# Patient Record
Sex: Male | Born: 1955 | Marital: Married | Smoking: Former smoker
Health system: Southern US, Community
[De-identification: ages and names within clinical notes are randomized; demographics above are authoritative.]

## PROBLEM LIST (undated history)

## (undated) DIAGNOSIS — E119 Type 2 diabetes mellitus without complications: Secondary | ICD-10-CM

---

## 2017-07-02 DIAGNOSIS — I10 Essential (primary) hypertension: Secondary | ICD-10-CM | POA: Diagnosis not present

## 2017-07-02 DIAGNOSIS — E1149 Type 2 diabetes mellitus with other diabetic neurological complication: Secondary | ICD-10-CM | POA: Diagnosis not present

## 2017-07-02 DIAGNOSIS — Z903 Acquired absence of stomach [part of]: Secondary | ICD-10-CM | POA: Diagnosis not present

## 2017-07-02 DIAGNOSIS — G20 Parkinson's disease: Secondary | ICD-10-CM | POA: Diagnosis not present

## 2017-07-02 DIAGNOSIS — Z6821 Body mass index (BMI) 21.0-21.9, adult: Secondary | ICD-10-CM | POA: Diagnosis not present

## 2017-07-02 DIAGNOSIS — Z794 Long term (current) use of insulin: Secondary | ICD-10-CM | POA: Diagnosis not present

## 2017-07-02 DIAGNOSIS — E785 Hyperlipidemia, unspecified: Secondary | ICD-10-CM | POA: Diagnosis not present

## 2017-07-02 DIAGNOSIS — I2510 Atherosclerotic heart disease of native coronary artery without angina pectoris: Secondary | ICD-10-CM | POA: Diagnosis not present

## 2017-07-21 DIAGNOSIS — R262 Difficulty in walking, not elsewhere classified: Secondary | ICD-10-CM | POA: Diagnosis not present

## 2017-07-21 DIAGNOSIS — Z6821 Body mass index (BMI) 21.0-21.9, adult: Secondary | ICD-10-CM | POA: Diagnosis not present

## 2017-07-21 DIAGNOSIS — G20 Parkinson's disease: Secondary | ICD-10-CM | POA: Diagnosis not present

## 2017-07-21 DIAGNOSIS — R739 Hyperglycemia, unspecified: Secondary | ICD-10-CM | POA: Diagnosis not present

## 2017-07-21 DIAGNOSIS — E1149 Type 2 diabetes mellitus with other diabetic neurological complication: Secondary | ICD-10-CM | POA: Diagnosis not present

## 2017-07-21 DIAGNOSIS — W19XXXA Unspecified fall, initial encounter: Secondary | ICD-10-CM | POA: Diagnosis not present

## 2017-07-29 DIAGNOSIS — R260 Ataxic gait: Secondary | ICD-10-CM | POA: Diagnosis not present

## 2017-07-29 DIAGNOSIS — W19XXXA Unspecified fall, initial encounter: Secondary | ICD-10-CM | POA: Diagnosis not present

## 2017-09-01 DIAGNOSIS — Z23 Encounter for immunization: Secondary | ICD-10-CM | POA: Diagnosis not present

## 2017-09-06 DIAGNOSIS — G20 Parkinson's disease: Secondary | ICD-10-CM | POA: Diagnosis not present

## 2017-09-06 DIAGNOSIS — R1312 Dysphagia, oropharyngeal phase: Secondary | ICD-10-CM | POA: Diagnosis not present

## 2017-11-01 DIAGNOSIS — Z9049 Acquired absence of other specified parts of digestive tract: Secondary | ICD-10-CM | POA: Diagnosis not present

## 2017-11-01 DIAGNOSIS — E1140 Type 2 diabetes mellitus with diabetic neuropathy, unspecified: Secondary | ICD-10-CM | POA: Diagnosis not present

## 2017-11-01 DIAGNOSIS — I2510 Atherosclerotic heart disease of native coronary artery without angina pectoris: Secondary | ICD-10-CM | POA: Diagnosis not present

## 2017-11-01 DIAGNOSIS — I10 Essential (primary) hypertension: Secondary | ICD-10-CM | POA: Diagnosis not present

## 2017-11-01 DIAGNOSIS — E785 Hyperlipidemia, unspecified: Secondary | ICD-10-CM | POA: Diagnosis not present

## 2017-11-11 DIAGNOSIS — Z87891 Personal history of nicotine dependence: Secondary | ICD-10-CM | POA: Diagnosis not present

## 2017-11-11 DIAGNOSIS — I6789 Other cerebrovascular disease: Secondary | ICD-10-CM | POA: Diagnosis not present

## 2017-11-11 DIAGNOSIS — Z7984 Long term (current) use of oral hypoglycemic drugs: Secondary | ICD-10-CM | POA: Diagnosis not present

## 2017-11-11 DIAGNOSIS — M25511 Pain in right shoulder: Secondary | ICD-10-CM | POA: Diagnosis not present

## 2017-11-11 DIAGNOSIS — G20 Parkinson's disease: Secondary | ICD-10-CM | POA: Diagnosis not present

## 2017-11-11 DIAGNOSIS — R05 Cough: Secondary | ICD-10-CM | POA: Diagnosis not present

## 2017-11-11 DIAGNOSIS — R270 Ataxia, unspecified: Secondary | ICD-10-CM | POA: Diagnosis not present

## 2017-11-11 DIAGNOSIS — Z79891 Long term (current) use of opiate analgesic: Secondary | ICD-10-CM | POA: Diagnosis not present

## 2017-11-11 DIAGNOSIS — Z79899 Other long term (current) drug therapy: Secondary | ICD-10-CM | POA: Diagnosis not present

## 2017-11-11 DIAGNOSIS — Z9181 History of falling: Secondary | ICD-10-CM | POA: Diagnosis not present

## 2017-11-11 DIAGNOSIS — I10 Essential (primary) hypertension: Secondary | ICD-10-CM | POA: Diagnosis not present

## 2017-11-11 DIAGNOSIS — E119 Type 2 diabetes mellitus without complications: Secondary | ICD-10-CM | POA: Diagnosis not present

## 2017-11-11 DIAGNOSIS — R4781 Slurred speech: Secondary | ICD-10-CM | POA: Diagnosis not present

## 2017-11-11 DIAGNOSIS — R531 Weakness: Secondary | ICD-10-CM | POA: Diagnosis not present

## 2017-11-13 DIAGNOSIS — Z794 Long term (current) use of insulin: Secondary | ICD-10-CM | POA: Diagnosis not present

## 2017-11-13 DIAGNOSIS — I10 Essential (primary) hypertension: Secondary | ICD-10-CM | POA: Diagnosis not present

## 2017-11-13 DIAGNOSIS — M25511 Pain in right shoulder: Secondary | ICD-10-CM | POA: Diagnosis not present

## 2017-11-13 DIAGNOSIS — E119 Type 2 diabetes mellitus without complications: Secondary | ICD-10-CM | POA: Diagnosis not present

## 2017-11-13 DIAGNOSIS — Z87891 Personal history of nicotine dependence: Secondary | ICD-10-CM | POA: Diagnosis not present

## 2017-11-13 DIAGNOSIS — G20 Parkinson's disease: Secondary | ICD-10-CM | POA: Diagnosis not present

## 2017-11-13 DIAGNOSIS — Z9181 History of falling: Secondary | ICD-10-CM | POA: Diagnosis not present

## 2017-11-15 DIAGNOSIS — M25511 Pain in right shoulder: Secondary | ICD-10-CM | POA: Diagnosis not present

## 2017-11-15 DIAGNOSIS — Z794 Long term (current) use of insulin: Secondary | ICD-10-CM | POA: Diagnosis not present

## 2017-11-15 DIAGNOSIS — Z87891 Personal history of nicotine dependence: Secondary | ICD-10-CM | POA: Diagnosis not present

## 2017-11-15 DIAGNOSIS — Z9181 History of falling: Secondary | ICD-10-CM | POA: Diagnosis not present

## 2017-11-15 DIAGNOSIS — G20 Parkinson's disease: Secondary | ICD-10-CM | POA: Diagnosis not present

## 2017-11-15 DIAGNOSIS — I10 Essential (primary) hypertension: Secondary | ICD-10-CM | POA: Diagnosis not present

## 2017-11-15 DIAGNOSIS — E119 Type 2 diabetes mellitus without complications: Secondary | ICD-10-CM | POA: Diagnosis not present

## 2017-11-16 DIAGNOSIS — Z9181 History of falling: Secondary | ICD-10-CM | POA: Diagnosis not present

## 2017-11-16 DIAGNOSIS — Z794 Long term (current) use of insulin: Secondary | ICD-10-CM | POA: Diagnosis not present

## 2017-11-16 DIAGNOSIS — Z87891 Personal history of nicotine dependence: Secondary | ICD-10-CM | POA: Diagnosis not present

## 2017-11-16 DIAGNOSIS — G20 Parkinson's disease: Secondary | ICD-10-CM | POA: Diagnosis not present

## 2017-11-16 DIAGNOSIS — M25511 Pain in right shoulder: Secondary | ICD-10-CM | POA: Diagnosis not present

## 2017-11-16 DIAGNOSIS — I10 Essential (primary) hypertension: Secondary | ICD-10-CM | POA: Diagnosis not present

## 2017-11-16 DIAGNOSIS — E119 Type 2 diabetes mellitus without complications: Secondary | ICD-10-CM | POA: Diagnosis not present

## 2017-11-18 DIAGNOSIS — Z794 Long term (current) use of insulin: Secondary | ICD-10-CM | POA: Diagnosis not present

## 2017-11-18 DIAGNOSIS — I10 Essential (primary) hypertension: Secondary | ICD-10-CM | POA: Diagnosis not present

## 2017-11-18 DIAGNOSIS — G20 Parkinson's disease: Secondary | ICD-10-CM | POA: Diagnosis not present

## 2017-11-18 DIAGNOSIS — E119 Type 2 diabetes mellitus without complications: Secondary | ICD-10-CM | POA: Diagnosis not present

## 2017-11-18 DIAGNOSIS — M25511 Pain in right shoulder: Secondary | ICD-10-CM | POA: Diagnosis not present

## 2017-11-18 DIAGNOSIS — Z9181 History of falling: Secondary | ICD-10-CM | POA: Diagnosis not present

## 2017-11-18 DIAGNOSIS — Z87891 Personal history of nicotine dependence: Secondary | ICD-10-CM | POA: Diagnosis not present

## 2017-11-19 DIAGNOSIS — Z794 Long term (current) use of insulin: Secondary | ICD-10-CM | POA: Diagnosis not present

## 2017-11-19 DIAGNOSIS — Z87891 Personal history of nicotine dependence: Secondary | ICD-10-CM | POA: Diagnosis not present

## 2017-11-19 DIAGNOSIS — I10 Essential (primary) hypertension: Secondary | ICD-10-CM | POA: Diagnosis not present

## 2017-11-19 DIAGNOSIS — Z9181 History of falling: Secondary | ICD-10-CM | POA: Diagnosis not present

## 2017-11-19 DIAGNOSIS — G20 Parkinson's disease: Secondary | ICD-10-CM | POA: Diagnosis not present

## 2017-11-19 DIAGNOSIS — E119 Type 2 diabetes mellitus without complications: Secondary | ICD-10-CM | POA: Diagnosis not present

## 2017-11-19 DIAGNOSIS — M25511 Pain in right shoulder: Secondary | ICD-10-CM | POA: Diagnosis not present

## 2017-11-22 ENCOUNTER — Other Ambulatory Visit: Payer: Self-pay | Admitting: *Deleted

## 2017-11-22 NOTE — Patient Outreach (Signed)
[facility]) Care Management  11/22/2017  Verline Lemaerry R Simerly 25-Mar-1956 [id number]   Outreach attempt #1 to patient. No answer. RN CM left HIPAA compliant message along with contact info.    Plan: RN CM will contact patient within one week.  Wynelle ClevelandJuanita Cashis Rill, RN, BSN, MHA/MSL, [facility] Telephonic Care Manager Coordinator [facility] Direct Phone: [phone] Cell Phone: [phone] Toll Free: [phone] Fax: [phone]

## 2017-11-24 DIAGNOSIS — Z794 Long term (current) use of insulin: Secondary | ICD-10-CM | POA: Diagnosis not present

## 2017-11-24 DIAGNOSIS — G20 Parkinson's disease: Secondary | ICD-10-CM | POA: Diagnosis not present

## 2017-11-24 DIAGNOSIS — E119 Type 2 diabetes mellitus without complications: Secondary | ICD-10-CM | POA: Diagnosis not present

## 2017-11-24 DIAGNOSIS — I10 Essential (primary) hypertension: Secondary | ICD-10-CM | POA: Diagnosis not present

## 2017-11-24 DIAGNOSIS — Z87891 Personal history of nicotine dependence: Secondary | ICD-10-CM | POA: Diagnosis not present

## 2017-11-24 DIAGNOSIS — Z9181 History of falling: Secondary | ICD-10-CM | POA: Diagnosis not present

## 2017-11-24 DIAGNOSIS — R278 Other lack of coordination: Secondary | ICD-10-CM | POA: Diagnosis not present

## 2017-11-24 DIAGNOSIS — M25511 Pain in right shoulder: Secondary | ICD-10-CM | POA: Diagnosis not present

## 2017-11-25 ENCOUNTER — Other Ambulatory Visit: Payer: Self-pay | Admitting: *Deleted

## 2017-11-25 DIAGNOSIS — Z87891 Personal history of nicotine dependence: Secondary | ICD-10-CM | POA: Diagnosis not present

## 2017-11-25 DIAGNOSIS — M25511 Pain in right shoulder: Secondary | ICD-10-CM | POA: Diagnosis not present

## 2017-11-25 DIAGNOSIS — Z9181 History of falling: Secondary | ICD-10-CM | POA: Diagnosis not present

## 2017-11-25 DIAGNOSIS — I10 Essential (primary) hypertension: Secondary | ICD-10-CM | POA: Diagnosis not present

## 2017-11-25 DIAGNOSIS — E119 Type 2 diabetes mellitus without complications: Secondary | ICD-10-CM | POA: Diagnosis not present

## 2017-11-25 DIAGNOSIS — Z794 Long term (current) use of insulin: Secondary | ICD-10-CM | POA: Diagnosis not present

## 2017-11-25 DIAGNOSIS — G20 Parkinson's disease: Secondary | ICD-10-CM | POA: Diagnosis not present

## 2017-11-25 NOTE — Patient Outreach (Signed)
[facility]) Care Management  11/25/2017  Joseph Mckay 1956/06/17 [id number]   Telephone Screen  Referral Date: 11/22/17 Referral Source: [employer] Referral Reason: His wife, Kennon HolterWanda Probus called me to advise that Joseph Mckay has been suffering from Parkinson's and the condition and medicine and her back issures make it hard to handle everyday tasks for his care.  Insurance: [employer]  Outreach attempt # 2 to patient. No answer. RN CM left HIPAA compliant message along with contact info.   Plan: RN CM will contact patient within one week.  Wynelle ClevelandJuanita Cinch Ormond, RN, BSN, MHA/MSL, [facility] Telephonic Care Manager Coordinator [facility] Direct Phone: [phone] Cell Phone: [phone] Toll Free: [phone] Fax: [phone]

## 2017-11-26 ENCOUNTER — Other Ambulatory Visit: Payer: Self-pay | Admitting: *Deleted

## 2017-11-26 ENCOUNTER — Ambulatory Visit: Payer: Self-pay | Admitting: *Deleted

## 2017-11-26 DIAGNOSIS — Z87891 Personal history of nicotine dependence: Secondary | ICD-10-CM | POA: Diagnosis not present

## 2017-11-26 DIAGNOSIS — H2513 Age-related nuclear cataract, bilateral: Secondary | ICD-10-CM | POA: Diagnosis not present

## 2017-11-26 DIAGNOSIS — Z9181 History of falling: Secondary | ICD-10-CM | POA: Diagnosis not present

## 2017-11-26 DIAGNOSIS — Z794 Long term (current) use of insulin: Secondary | ICD-10-CM | POA: Diagnosis not present

## 2017-11-26 DIAGNOSIS — G20 Parkinson's disease: Secondary | ICD-10-CM | POA: Diagnosis not present

## 2017-11-26 DIAGNOSIS — I10 Essential (primary) hypertension: Secondary | ICD-10-CM | POA: Diagnosis not present

## 2017-11-26 DIAGNOSIS — M25511 Pain in right shoulder: Secondary | ICD-10-CM | POA: Diagnosis not present

## 2017-11-26 DIAGNOSIS — E119 Type 2 diabetes mellitus without complications: Secondary | ICD-10-CM | POA: Diagnosis not present

## 2017-11-29 NOTE — Patient Outreach (Signed)
[facility]) Care Management  11/26/17  Verline Lemaerry R Suleiman 10-08-1956 [id number]   Telephone outreach to patient. HIPAA verified with patient. Patient requested for RN CM to speak with his spouse. Patient does not have a [facility] MD. He doesn't qualify for any [facility] services.   Plan: RN CM will notify [facility] Case Management Assistant regarding case closure.  RN CM to notify patient regarding case closure reason.  Wynelle ClevelandJuanita Candon Caras, RN, BSN, MHA/MSL, [facility] Telephonic Care Manager Coordinator [facility] Direct Phone: [phone] Cell Phone: [phone] Toll Free: [phone] Fax: [phone]

## 2017-12-02 ENCOUNTER — Ambulatory Visit: Payer: Self-pay | Admitting: *Deleted

## 2017-12-02 DIAGNOSIS — E119 Type 2 diabetes mellitus without complications: Secondary | ICD-10-CM | POA: Diagnosis not present

## 2017-12-02 DIAGNOSIS — G20 Parkinson's disease: Secondary | ICD-10-CM | POA: Diagnosis not present

## 2017-12-02 DIAGNOSIS — Z794 Long term (current) use of insulin: Secondary | ICD-10-CM | POA: Diagnosis not present

## 2017-12-02 DIAGNOSIS — Z87891 Personal history of nicotine dependence: Secondary | ICD-10-CM | POA: Diagnosis not present

## 2017-12-02 DIAGNOSIS — M25511 Pain in right shoulder: Secondary | ICD-10-CM | POA: Diagnosis not present

## 2017-12-02 DIAGNOSIS — I10 Essential (primary) hypertension: Secondary | ICD-10-CM | POA: Diagnosis not present

## 2017-12-02 DIAGNOSIS — Z9181 History of falling: Secondary | ICD-10-CM | POA: Diagnosis not present

## 2017-12-02 DIAGNOSIS — R1312 Dysphagia, oropharyngeal phase: Secondary | ICD-10-CM | POA: Diagnosis not present

## 2017-12-03 ENCOUNTER — Other Ambulatory Visit: Payer: Self-pay | Admitting: *Deleted

## 2017-12-03 DIAGNOSIS — Z9181 History of falling: Secondary | ICD-10-CM | POA: Diagnosis not present

## 2017-12-03 DIAGNOSIS — I10 Essential (primary) hypertension: Secondary | ICD-10-CM | POA: Diagnosis not present

## 2017-12-03 DIAGNOSIS — G20 Parkinson's disease: Secondary | ICD-10-CM | POA: Diagnosis not present

## 2017-12-03 DIAGNOSIS — Z87891 Personal history of nicotine dependence: Secondary | ICD-10-CM | POA: Diagnosis not present

## 2017-12-03 DIAGNOSIS — M25511 Pain in right shoulder: Secondary | ICD-10-CM | POA: Diagnosis not present

## 2017-12-03 DIAGNOSIS — Z794 Long term (current) use of insulin: Secondary | ICD-10-CM | POA: Diagnosis not present

## 2017-12-03 DIAGNOSIS — E119 Type 2 diabetes mellitus without complications: Secondary | ICD-10-CM | POA: Diagnosis not present

## 2017-12-03 DIAGNOSIS — R1312 Dysphagia, oropharyngeal phase: Secondary | ICD-10-CM | POA: Diagnosis not present

## 2017-12-03 NOTE — Patient Outreach (Signed)
[facility]) Care Management  12/02/17  Joseph Mckay Joseph Mckay 03/07/1956 [id number]   Outreach telephone call to patient. HIPAA verified with patient's spouse, per patient's request. Spouse informed that patient doesn't qualify for [facility] services. Patient's primary MD is not a [facility]. Spouse became very upset. She stated, she and patient was told patient qualified for in-home patient services. RN CM told spouse patient qualifies for in-home patient services, however he doesn't qualify for [facility] services. Spouse stated, "She has been told so many lies by the insurance company". RN CM attempted to explain [facility] services and benefits. Spouse was ready to end the telephone call.   Plan: RN CM will notify [facility] CM administrative assistant regarding case closure.  RN CM will send patient case closure letter.   Wynelle ClevelandJuanita Carlton Buskey, RN, BSN, MHA/MSL, [facility] Telephonic Care Manager Coordinator [facility] Direct Phone: [phone] Cell Phone: [phone] Toll Free: [phone] Fax: [phone]

## 2017-12-07 DIAGNOSIS — G20 Parkinson's disease: Secondary | ICD-10-CM | POA: Diagnosis not present

## 2017-12-07 DIAGNOSIS — Z9181 History of falling: Secondary | ICD-10-CM | POA: Diagnosis not present

## 2017-12-07 DIAGNOSIS — I10 Essential (primary) hypertension: Secondary | ICD-10-CM | POA: Diagnosis not present

## 2017-12-07 DIAGNOSIS — E119 Type 2 diabetes mellitus without complications: Secondary | ICD-10-CM | POA: Diagnosis not present

## 2017-12-07 DIAGNOSIS — Z794 Long term (current) use of insulin: Secondary | ICD-10-CM | POA: Diagnosis not present

## 2017-12-07 DIAGNOSIS — Z87891 Personal history of nicotine dependence: Secondary | ICD-10-CM | POA: Diagnosis not present

## 2017-12-07 DIAGNOSIS — R1312 Dysphagia, oropharyngeal phase: Secondary | ICD-10-CM | POA: Diagnosis not present

## 2017-12-07 DIAGNOSIS — M25511 Pain in right shoulder: Secondary | ICD-10-CM | POA: Diagnosis not present

## 2017-12-08 DIAGNOSIS — I10 Essential (primary) hypertension: Secondary | ICD-10-CM | POA: Diagnosis not present

## 2017-12-08 DIAGNOSIS — Z9181 History of falling: Secondary | ICD-10-CM | POA: Diagnosis not present

## 2017-12-08 DIAGNOSIS — Z87891 Personal history of nicotine dependence: Secondary | ICD-10-CM | POA: Diagnosis not present

## 2017-12-08 DIAGNOSIS — G20 Parkinson's disease: Secondary | ICD-10-CM | POA: Diagnosis not present

## 2017-12-08 DIAGNOSIS — Z794 Long term (current) use of insulin: Secondary | ICD-10-CM | POA: Diagnosis not present

## 2017-12-08 DIAGNOSIS — E119 Type 2 diabetes mellitus without complications: Secondary | ICD-10-CM | POA: Diagnosis not present

## 2017-12-08 DIAGNOSIS — M25511 Pain in right shoulder: Secondary | ICD-10-CM | POA: Diagnosis not present

## 2017-12-08 DIAGNOSIS — R1312 Dysphagia, oropharyngeal phase: Secondary | ICD-10-CM | POA: Diagnosis not present

## 2017-12-09 DIAGNOSIS — R1312 Dysphagia, oropharyngeal phase: Secondary | ICD-10-CM | POA: Diagnosis not present

## 2017-12-09 DIAGNOSIS — E119 Type 2 diabetes mellitus without complications: Secondary | ICD-10-CM | POA: Diagnosis not present

## 2017-12-09 DIAGNOSIS — Z9181 History of falling: Secondary | ICD-10-CM | POA: Diagnosis not present

## 2017-12-09 DIAGNOSIS — I10 Essential (primary) hypertension: Secondary | ICD-10-CM | POA: Diagnosis not present

## 2017-12-09 DIAGNOSIS — G20 Parkinson's disease: Secondary | ICD-10-CM | POA: Diagnosis not present

## 2017-12-09 DIAGNOSIS — Z794 Long term (current) use of insulin: Secondary | ICD-10-CM | POA: Diagnosis not present

## 2017-12-09 DIAGNOSIS — Z87891 Personal history of nicotine dependence: Secondary | ICD-10-CM | POA: Diagnosis not present

## 2017-12-09 DIAGNOSIS — M25511 Pain in right shoulder: Secondary | ICD-10-CM | POA: Diagnosis not present

## 2017-12-13 DIAGNOSIS — I10 Essential (primary) hypertension: Secondary | ICD-10-CM | POA: Diagnosis not present

## 2017-12-13 DIAGNOSIS — Z9181 History of falling: Secondary | ICD-10-CM | POA: Diagnosis not present

## 2017-12-13 DIAGNOSIS — G20 Parkinson's disease: Secondary | ICD-10-CM | POA: Diagnosis not present

## 2017-12-13 DIAGNOSIS — E119 Type 2 diabetes mellitus without complications: Secondary | ICD-10-CM | POA: Diagnosis not present

## 2017-12-13 DIAGNOSIS — R1312 Dysphagia, oropharyngeal phase: Secondary | ICD-10-CM | POA: Diagnosis not present

## 2017-12-13 DIAGNOSIS — Z87891 Personal history of nicotine dependence: Secondary | ICD-10-CM | POA: Diagnosis not present

## 2017-12-13 DIAGNOSIS — Z794 Long term (current) use of insulin: Secondary | ICD-10-CM | POA: Diagnosis not present

## 2017-12-13 DIAGNOSIS — M25511 Pain in right shoulder: Secondary | ICD-10-CM | POA: Diagnosis not present

## 2017-12-14 DIAGNOSIS — E119 Type 2 diabetes mellitus without complications: Secondary | ICD-10-CM | POA: Diagnosis not present

## 2017-12-14 DIAGNOSIS — R1312 Dysphagia, oropharyngeal phase: Secondary | ICD-10-CM | POA: Diagnosis not present

## 2017-12-14 DIAGNOSIS — Z794 Long term (current) use of insulin: Secondary | ICD-10-CM | POA: Diagnosis not present

## 2017-12-14 DIAGNOSIS — G20 Parkinson's disease: Secondary | ICD-10-CM | POA: Diagnosis not present

## 2017-12-14 DIAGNOSIS — Z87891 Personal history of nicotine dependence: Secondary | ICD-10-CM | POA: Diagnosis not present

## 2017-12-14 DIAGNOSIS — M25511 Pain in right shoulder: Secondary | ICD-10-CM | POA: Diagnosis not present

## 2017-12-14 DIAGNOSIS — Z9181 History of falling: Secondary | ICD-10-CM | POA: Diagnosis not present

## 2017-12-14 DIAGNOSIS — I10 Essential (primary) hypertension: Secondary | ICD-10-CM | POA: Diagnosis not present

## 2017-12-15 DIAGNOSIS — I10 Essential (primary) hypertension: Secondary | ICD-10-CM | POA: Diagnosis not present

## 2017-12-15 DIAGNOSIS — G20 Parkinson's disease: Secondary | ICD-10-CM | POA: Diagnosis not present

## 2017-12-15 DIAGNOSIS — Z87891 Personal history of nicotine dependence: Secondary | ICD-10-CM | POA: Diagnosis not present

## 2017-12-15 DIAGNOSIS — Z9181 History of falling: Secondary | ICD-10-CM | POA: Diagnosis not present

## 2017-12-15 DIAGNOSIS — E119 Type 2 diabetes mellitus without complications: Secondary | ICD-10-CM | POA: Diagnosis not present

## 2017-12-15 DIAGNOSIS — M25511 Pain in right shoulder: Secondary | ICD-10-CM | POA: Diagnosis not present

## 2017-12-15 DIAGNOSIS — Z794 Long term (current) use of insulin: Secondary | ICD-10-CM | POA: Diagnosis not present

## 2017-12-15 DIAGNOSIS — R1312 Dysphagia, oropharyngeal phase: Secondary | ICD-10-CM | POA: Diagnosis not present

## 2017-12-17 DIAGNOSIS — M25511 Pain in right shoulder: Secondary | ICD-10-CM | POA: Diagnosis not present

## 2017-12-17 DIAGNOSIS — Z87891 Personal history of nicotine dependence: Secondary | ICD-10-CM | POA: Diagnosis not present

## 2017-12-17 DIAGNOSIS — E119 Type 2 diabetes mellitus without complications: Secondary | ICD-10-CM | POA: Diagnosis not present

## 2017-12-17 DIAGNOSIS — Z9181 History of falling: Secondary | ICD-10-CM | POA: Diagnosis not present

## 2017-12-17 DIAGNOSIS — Z794 Long term (current) use of insulin: Secondary | ICD-10-CM | POA: Diagnosis not present

## 2017-12-17 DIAGNOSIS — R1312 Dysphagia, oropharyngeal phase: Secondary | ICD-10-CM | POA: Diagnosis not present

## 2017-12-17 DIAGNOSIS — I10 Essential (primary) hypertension: Secondary | ICD-10-CM | POA: Diagnosis not present

## 2017-12-17 DIAGNOSIS — G20 Parkinson's disease: Secondary | ICD-10-CM | POA: Diagnosis not present

## 2017-12-21 DIAGNOSIS — E119 Type 2 diabetes mellitus without complications: Secondary | ICD-10-CM | POA: Diagnosis not present

## 2017-12-21 DIAGNOSIS — G20 Parkinson's disease: Secondary | ICD-10-CM | POA: Diagnosis not present

## 2017-12-21 DIAGNOSIS — Z9181 History of falling: Secondary | ICD-10-CM | POA: Diagnosis not present

## 2017-12-21 DIAGNOSIS — Z794 Long term (current) use of insulin: Secondary | ICD-10-CM | POA: Diagnosis not present

## 2017-12-21 DIAGNOSIS — I10 Essential (primary) hypertension: Secondary | ICD-10-CM | POA: Diagnosis not present

## 2017-12-21 DIAGNOSIS — M25511 Pain in right shoulder: Secondary | ICD-10-CM | POA: Diagnosis not present

## 2017-12-21 DIAGNOSIS — Z87891 Personal history of nicotine dependence: Secondary | ICD-10-CM | POA: Diagnosis not present

## 2017-12-21 DIAGNOSIS — R1312 Dysphagia, oropharyngeal phase: Secondary | ICD-10-CM | POA: Diagnosis not present

## 2017-12-22 DIAGNOSIS — Z87891 Personal history of nicotine dependence: Secondary | ICD-10-CM | POA: Diagnosis not present

## 2017-12-22 DIAGNOSIS — Z794 Long term (current) use of insulin: Secondary | ICD-10-CM | POA: Diagnosis not present

## 2017-12-22 DIAGNOSIS — R1312 Dysphagia, oropharyngeal phase: Secondary | ICD-10-CM | POA: Diagnosis not present

## 2017-12-22 DIAGNOSIS — G20 Parkinson's disease: Secondary | ICD-10-CM | POA: Diagnosis not present

## 2017-12-22 DIAGNOSIS — E119 Type 2 diabetes mellitus without complications: Secondary | ICD-10-CM | POA: Diagnosis not present

## 2017-12-22 DIAGNOSIS — I10 Essential (primary) hypertension: Secondary | ICD-10-CM | POA: Diagnosis not present

## 2017-12-22 DIAGNOSIS — M25511 Pain in right shoulder: Secondary | ICD-10-CM | POA: Diagnosis not present

## 2017-12-22 DIAGNOSIS — Z9181 History of falling: Secondary | ICD-10-CM | POA: Diagnosis not present

## 2017-12-23 DIAGNOSIS — Z794 Long term (current) use of insulin: Secondary | ICD-10-CM | POA: Diagnosis not present

## 2017-12-23 DIAGNOSIS — Z9181 History of falling: Secondary | ICD-10-CM | POA: Diagnosis not present

## 2017-12-23 DIAGNOSIS — G20 Parkinson's disease: Secondary | ICD-10-CM | POA: Diagnosis not present

## 2017-12-23 DIAGNOSIS — R1312 Dysphagia, oropharyngeal phase: Secondary | ICD-10-CM | POA: Diagnosis not present

## 2017-12-23 DIAGNOSIS — M25511 Pain in right shoulder: Secondary | ICD-10-CM | POA: Diagnosis not present

## 2017-12-23 DIAGNOSIS — E119 Type 2 diabetes mellitus without complications: Secondary | ICD-10-CM | POA: Diagnosis not present

## 2017-12-23 DIAGNOSIS — Z87891 Personal history of nicotine dependence: Secondary | ICD-10-CM | POA: Diagnosis not present

## 2017-12-23 DIAGNOSIS — I10 Essential (primary) hypertension: Secondary | ICD-10-CM | POA: Diagnosis not present

## 2017-12-24 DIAGNOSIS — Z87891 Personal history of nicotine dependence: Secondary | ICD-10-CM | POA: Diagnosis not present

## 2017-12-24 DIAGNOSIS — Z794 Long term (current) use of insulin: Secondary | ICD-10-CM | POA: Diagnosis not present

## 2017-12-24 DIAGNOSIS — I10 Essential (primary) hypertension: Secondary | ICD-10-CM | POA: Diagnosis not present

## 2017-12-24 DIAGNOSIS — R1312 Dysphagia, oropharyngeal phase: Secondary | ICD-10-CM | POA: Diagnosis not present

## 2017-12-24 DIAGNOSIS — Z9181 History of falling: Secondary | ICD-10-CM | POA: Diagnosis not present

## 2017-12-24 DIAGNOSIS — M25511 Pain in right shoulder: Secondary | ICD-10-CM | POA: Diagnosis not present

## 2017-12-24 DIAGNOSIS — E119 Type 2 diabetes mellitus without complications: Secondary | ICD-10-CM | POA: Diagnosis not present

## 2017-12-24 DIAGNOSIS — G20 Parkinson's disease: Secondary | ICD-10-CM | POA: Diagnosis not present

## 2017-12-27 DIAGNOSIS — Z87891 Personal history of nicotine dependence: Secondary | ICD-10-CM | POA: Diagnosis not present

## 2017-12-27 DIAGNOSIS — Z9181 History of falling: Secondary | ICD-10-CM | POA: Diagnosis not present

## 2017-12-27 DIAGNOSIS — Z794 Long term (current) use of insulin: Secondary | ICD-10-CM | POA: Diagnosis not present

## 2017-12-27 DIAGNOSIS — M25511 Pain in right shoulder: Secondary | ICD-10-CM | POA: Diagnosis not present

## 2017-12-27 DIAGNOSIS — I10 Essential (primary) hypertension: Secondary | ICD-10-CM | POA: Diagnosis not present

## 2017-12-27 DIAGNOSIS — E119 Type 2 diabetes mellitus without complications: Secondary | ICD-10-CM | POA: Diagnosis not present

## 2017-12-27 DIAGNOSIS — G20 Parkinson's disease: Secondary | ICD-10-CM | POA: Diagnosis not present

## 2017-12-27 DIAGNOSIS — R1312 Dysphagia, oropharyngeal phase: Secondary | ICD-10-CM | POA: Diagnosis not present

## 2018-01-03 DIAGNOSIS — G20 Parkinson's disease: Secondary | ICD-10-CM | POA: Diagnosis not present

## 2018-01-03 DIAGNOSIS — I10 Essential (primary) hypertension: Secondary | ICD-10-CM | POA: Diagnosis not present

## 2018-01-03 DIAGNOSIS — Z87891 Personal history of nicotine dependence: Secondary | ICD-10-CM | POA: Diagnosis not present

## 2018-01-03 DIAGNOSIS — Z9181 History of falling: Secondary | ICD-10-CM | POA: Diagnosis not present

## 2018-01-03 DIAGNOSIS — M25511 Pain in right shoulder: Secondary | ICD-10-CM | POA: Diagnosis not present

## 2018-01-03 DIAGNOSIS — R1312 Dysphagia, oropharyngeal phase: Secondary | ICD-10-CM | POA: Diagnosis not present

## 2018-01-03 DIAGNOSIS — E119 Type 2 diabetes mellitus without complications: Secondary | ICD-10-CM | POA: Diagnosis not present

## 2018-01-03 DIAGNOSIS — Z794 Long term (current) use of insulin: Secondary | ICD-10-CM | POA: Diagnosis not present

## 2018-01-07 DIAGNOSIS — G20 Parkinson's disease: Secondary | ICD-10-CM | POA: Diagnosis not present

## 2018-01-07 DIAGNOSIS — R1312 Dysphagia, oropharyngeal phase: Secondary | ICD-10-CM | POA: Diagnosis not present

## 2018-01-07 DIAGNOSIS — I10 Essential (primary) hypertension: Secondary | ICD-10-CM | POA: Diagnosis not present

## 2018-01-07 DIAGNOSIS — E119 Type 2 diabetes mellitus without complications: Secondary | ICD-10-CM | POA: Diagnosis not present

## 2018-01-07 DIAGNOSIS — Z794 Long term (current) use of insulin: Secondary | ICD-10-CM | POA: Diagnosis not present

## 2018-01-07 DIAGNOSIS — Z87891 Personal history of nicotine dependence: Secondary | ICD-10-CM | POA: Diagnosis not present

## 2018-01-07 DIAGNOSIS — M25511 Pain in right shoulder: Secondary | ICD-10-CM | POA: Diagnosis not present

## 2018-01-07 DIAGNOSIS — Z9181 History of falling: Secondary | ICD-10-CM | POA: Diagnosis not present

## 2018-01-14 DIAGNOSIS — I10 Essential (primary) hypertension: Secondary | ICD-10-CM | POA: Diagnosis not present

## 2018-01-14 DIAGNOSIS — R1312 Dysphagia, oropharyngeal phase: Secondary | ICD-10-CM | POA: Diagnosis not present

## 2018-01-14 DIAGNOSIS — Z87891 Personal history of nicotine dependence: Secondary | ICD-10-CM | POA: Diagnosis not present

## 2018-01-14 DIAGNOSIS — G20 Parkinson's disease: Secondary | ICD-10-CM | POA: Diagnosis not present

## 2018-01-14 DIAGNOSIS — Z9181 History of falling: Secondary | ICD-10-CM | POA: Diagnosis not present

## 2018-01-14 DIAGNOSIS — M25511 Pain in right shoulder: Secondary | ICD-10-CM | POA: Diagnosis not present

## 2018-01-14 DIAGNOSIS — Z794 Long term (current) use of insulin: Secondary | ICD-10-CM | POA: Diagnosis not present

## 2018-01-14 DIAGNOSIS — E119 Type 2 diabetes mellitus without complications: Secondary | ICD-10-CM | POA: Diagnosis not present

## 2018-01-21 DIAGNOSIS — Z87891 Personal history of nicotine dependence: Secondary | ICD-10-CM | POA: Diagnosis not present

## 2018-01-21 DIAGNOSIS — I10 Essential (primary) hypertension: Secondary | ICD-10-CM | POA: Diagnosis not present

## 2018-01-21 DIAGNOSIS — G20 Parkinson's disease: Secondary | ICD-10-CM | POA: Diagnosis not present

## 2018-01-21 DIAGNOSIS — Z9181 History of falling: Secondary | ICD-10-CM | POA: Diagnosis not present

## 2018-01-21 DIAGNOSIS — Z794 Long term (current) use of insulin: Secondary | ICD-10-CM | POA: Diagnosis not present

## 2018-01-21 DIAGNOSIS — E119 Type 2 diabetes mellitus without complications: Secondary | ICD-10-CM | POA: Diagnosis not present

## 2018-01-21 DIAGNOSIS — R1312 Dysphagia, oropharyngeal phase: Secondary | ICD-10-CM | POA: Diagnosis not present

## 2018-01-21 DIAGNOSIS — M25511 Pain in right shoulder: Secondary | ICD-10-CM | POA: Diagnosis not present

## 2018-01-28 DIAGNOSIS — R1312 Dysphagia, oropharyngeal phase: Secondary | ICD-10-CM | POA: Diagnosis not present

## 2018-01-28 DIAGNOSIS — G20 Parkinson's disease: Secondary | ICD-10-CM | POA: Diagnosis not present

## 2018-01-28 DIAGNOSIS — M25511 Pain in right shoulder: Secondary | ICD-10-CM | POA: Diagnosis not present

## 2018-01-28 DIAGNOSIS — Z794 Long term (current) use of insulin: Secondary | ICD-10-CM | POA: Diagnosis not present

## 2018-01-28 DIAGNOSIS — Z9181 History of falling: Secondary | ICD-10-CM | POA: Diagnosis not present

## 2018-01-28 DIAGNOSIS — E119 Type 2 diabetes mellitus without complications: Secondary | ICD-10-CM | POA: Diagnosis not present

## 2018-01-28 DIAGNOSIS — I10 Essential (primary) hypertension: Secondary | ICD-10-CM | POA: Diagnosis not present

## 2018-01-28 DIAGNOSIS — Z87891 Personal history of nicotine dependence: Secondary | ICD-10-CM | POA: Diagnosis not present

## 2018-02-02 DIAGNOSIS — E119 Type 2 diabetes mellitus without complications: Secondary | ICD-10-CM | POA: Diagnosis not present

## 2018-02-02 DIAGNOSIS — Z87891 Personal history of nicotine dependence: Secondary | ICD-10-CM | POA: Diagnosis not present

## 2018-02-02 DIAGNOSIS — M25511 Pain in right shoulder: Secondary | ICD-10-CM | POA: Diagnosis not present

## 2018-02-02 DIAGNOSIS — G20 Parkinson's disease: Secondary | ICD-10-CM | POA: Diagnosis not present

## 2018-02-02 DIAGNOSIS — I10 Essential (primary) hypertension: Secondary | ICD-10-CM | POA: Diagnosis not present

## 2018-02-02 DIAGNOSIS — R1312 Dysphagia, oropharyngeal phase: Secondary | ICD-10-CM | POA: Diagnosis not present

## 2018-02-02 DIAGNOSIS — Z9181 History of falling: Secondary | ICD-10-CM | POA: Diagnosis not present

## 2018-02-02 DIAGNOSIS — Z794 Long term (current) use of insulin: Secondary | ICD-10-CM | POA: Diagnosis not present

## 2018-02-04 DIAGNOSIS — Z794 Long term (current) use of insulin: Secondary | ICD-10-CM | POA: Diagnosis not present

## 2018-02-04 DIAGNOSIS — I10 Essential (primary) hypertension: Secondary | ICD-10-CM | POA: Diagnosis not present

## 2018-02-04 DIAGNOSIS — Z9181 History of falling: Secondary | ICD-10-CM | POA: Diagnosis not present

## 2018-02-04 DIAGNOSIS — G20 Parkinson's disease: Secondary | ICD-10-CM | POA: Diagnosis not present

## 2018-02-04 DIAGNOSIS — M25511 Pain in right shoulder: Secondary | ICD-10-CM | POA: Diagnosis not present

## 2018-02-04 DIAGNOSIS — Z87891 Personal history of nicotine dependence: Secondary | ICD-10-CM | POA: Diagnosis not present

## 2018-02-04 DIAGNOSIS — E119 Type 2 diabetes mellitus without complications: Secondary | ICD-10-CM | POA: Diagnosis not present

## 2018-02-04 DIAGNOSIS — R1312 Dysphagia, oropharyngeal phase: Secondary | ICD-10-CM | POA: Diagnosis not present

## 2018-02-15 DIAGNOSIS — I10 Essential (primary) hypertension: Secondary | ICD-10-CM | POA: Diagnosis not present

## 2018-02-15 DIAGNOSIS — R1312 Dysphagia, oropharyngeal phase: Secondary | ICD-10-CM | POA: Diagnosis not present

## 2018-02-15 DIAGNOSIS — Z794 Long term (current) use of insulin: Secondary | ICD-10-CM | POA: Diagnosis not present

## 2018-02-15 DIAGNOSIS — E119 Type 2 diabetes mellitus without complications: Secondary | ICD-10-CM | POA: Diagnosis not present

## 2018-02-15 DIAGNOSIS — Z87891 Personal history of nicotine dependence: Secondary | ICD-10-CM | POA: Diagnosis not present

## 2018-02-15 DIAGNOSIS — Z9181 History of falling: Secondary | ICD-10-CM | POA: Diagnosis not present

## 2018-02-15 DIAGNOSIS — M25511 Pain in right shoulder: Secondary | ICD-10-CM | POA: Diagnosis not present

## 2018-02-15 DIAGNOSIS — G20 Parkinson's disease: Secondary | ICD-10-CM | POA: Diagnosis not present

## 2018-02-17 DIAGNOSIS — R1312 Dysphagia, oropharyngeal phase: Secondary | ICD-10-CM | POA: Diagnosis not present

## 2018-02-17 DIAGNOSIS — Z794 Long term (current) use of insulin: Secondary | ICD-10-CM | POA: Diagnosis not present

## 2018-02-17 DIAGNOSIS — Z87891 Personal history of nicotine dependence: Secondary | ICD-10-CM | POA: Diagnosis not present

## 2018-02-17 DIAGNOSIS — G20 Parkinson's disease: Secondary | ICD-10-CM | POA: Diagnosis not present

## 2018-02-17 DIAGNOSIS — E119 Type 2 diabetes mellitus without complications: Secondary | ICD-10-CM | POA: Diagnosis not present

## 2018-02-17 DIAGNOSIS — I10 Essential (primary) hypertension: Secondary | ICD-10-CM | POA: Diagnosis not present

## 2018-02-17 DIAGNOSIS — Z9181 History of falling: Secondary | ICD-10-CM | POA: Diagnosis not present

## 2018-02-17 DIAGNOSIS — M25511 Pain in right shoulder: Secondary | ICD-10-CM | POA: Diagnosis not present

## 2018-02-18 DIAGNOSIS — G3183 Dementia with Lewy bodies: Secondary | ICD-10-CM | POA: Diagnosis not present

## 2018-02-18 DIAGNOSIS — F0280 Dementia in other diseases classified elsewhere without behavioral disturbance: Secondary | ICD-10-CM | POA: Diagnosis not present

## 2018-02-22 DIAGNOSIS — R1312 Dysphagia, oropharyngeal phase: Secondary | ICD-10-CM | POA: Diagnosis not present

## 2018-02-22 DIAGNOSIS — Z9181 History of falling: Secondary | ICD-10-CM | POA: Diagnosis not present

## 2018-02-22 DIAGNOSIS — G20 Parkinson's disease: Secondary | ICD-10-CM | POA: Diagnosis not present

## 2018-02-22 DIAGNOSIS — E119 Type 2 diabetes mellitus without complications: Secondary | ICD-10-CM | POA: Diagnosis not present

## 2018-02-22 DIAGNOSIS — Z794 Long term (current) use of insulin: Secondary | ICD-10-CM | POA: Diagnosis not present

## 2018-02-22 DIAGNOSIS — I10 Essential (primary) hypertension: Secondary | ICD-10-CM | POA: Diagnosis not present

## 2018-02-22 DIAGNOSIS — Z87891 Personal history of nicotine dependence: Secondary | ICD-10-CM | POA: Diagnosis not present

## 2018-02-22 DIAGNOSIS — M25511 Pain in right shoulder: Secondary | ICD-10-CM | POA: Diagnosis not present

## 2018-02-23 DIAGNOSIS — M7541 Impingement syndrome of right shoulder: Secondary | ICD-10-CM | POA: Diagnosis not present

## 2018-02-24 DIAGNOSIS — Z9181 History of falling: Secondary | ICD-10-CM | POA: Diagnosis not present

## 2018-02-24 DIAGNOSIS — I10 Essential (primary) hypertension: Secondary | ICD-10-CM | POA: Diagnosis not present

## 2018-02-24 DIAGNOSIS — G20 Parkinson's disease: Secondary | ICD-10-CM | POA: Diagnosis not present

## 2018-02-24 DIAGNOSIS — R1312 Dysphagia, oropharyngeal phase: Secondary | ICD-10-CM | POA: Diagnosis not present

## 2018-02-24 DIAGNOSIS — M25511 Pain in right shoulder: Secondary | ICD-10-CM | POA: Diagnosis not present

## 2018-02-24 DIAGNOSIS — Z87891 Personal history of nicotine dependence: Secondary | ICD-10-CM | POA: Diagnosis not present

## 2018-02-24 DIAGNOSIS — E119 Type 2 diabetes mellitus without complications: Secondary | ICD-10-CM | POA: Diagnosis not present

## 2018-02-24 DIAGNOSIS — Z794 Long term (current) use of insulin: Secondary | ICD-10-CM | POA: Diagnosis not present

## 2018-02-28 DIAGNOSIS — I10 Essential (primary) hypertension: Secondary | ICD-10-CM | POA: Diagnosis not present

## 2018-02-28 DIAGNOSIS — R2689 Other abnormalities of gait and mobility: Secondary | ICD-10-CM | POA: Diagnosis not present

## 2018-02-28 DIAGNOSIS — I2510 Atherosclerotic heart disease of native coronary artery without angina pectoris: Secondary | ICD-10-CM | POA: Diagnosis not present

## 2018-02-28 DIAGNOSIS — G4700 Insomnia, unspecified: Secondary | ICD-10-CM | POA: Diagnosis not present

## 2018-02-28 DIAGNOSIS — M5137 Other intervertebral disc degeneration, lumbosacral region: Secondary | ICD-10-CM | POA: Diagnosis not present

## 2018-02-28 DIAGNOSIS — E1142 Type 2 diabetes mellitus with diabetic polyneuropathy: Secondary | ICD-10-CM | POA: Diagnosis not present

## 2018-03-01 DIAGNOSIS — Z794 Long term (current) use of insulin: Secondary | ICD-10-CM | POA: Diagnosis not present

## 2018-03-01 DIAGNOSIS — G20 Parkinson's disease: Secondary | ICD-10-CM | POA: Diagnosis not present

## 2018-03-01 DIAGNOSIS — R1312 Dysphagia, oropharyngeal phase: Secondary | ICD-10-CM | POA: Diagnosis not present

## 2018-03-01 DIAGNOSIS — Z9181 History of falling: Secondary | ICD-10-CM | POA: Diagnosis not present

## 2018-03-01 DIAGNOSIS — M25511 Pain in right shoulder: Secondary | ICD-10-CM | POA: Diagnosis not present

## 2018-03-01 DIAGNOSIS — I10 Essential (primary) hypertension: Secondary | ICD-10-CM | POA: Diagnosis not present

## 2018-03-01 DIAGNOSIS — E119 Type 2 diabetes mellitus without complications: Secondary | ICD-10-CM | POA: Diagnosis not present

## 2018-03-01 DIAGNOSIS — Z87891 Personal history of nicotine dependence: Secondary | ICD-10-CM | POA: Diagnosis not present

## 2018-03-03 DIAGNOSIS — I10 Essential (primary) hypertension: Secondary | ICD-10-CM | POA: Diagnosis not present

## 2018-03-03 DIAGNOSIS — Z87891 Personal history of nicotine dependence: Secondary | ICD-10-CM | POA: Diagnosis not present

## 2018-03-03 DIAGNOSIS — M25511 Pain in right shoulder: Secondary | ICD-10-CM | POA: Diagnosis not present

## 2018-03-03 DIAGNOSIS — G20 Parkinson's disease: Secondary | ICD-10-CM | POA: Diagnosis not present

## 2018-03-03 DIAGNOSIS — Z9181 History of falling: Secondary | ICD-10-CM | POA: Diagnosis not present

## 2018-03-03 DIAGNOSIS — R1312 Dysphagia, oropharyngeal phase: Secondary | ICD-10-CM | POA: Diagnosis not present

## 2018-03-03 DIAGNOSIS — Z794 Long term (current) use of insulin: Secondary | ICD-10-CM | POA: Diagnosis not present

## 2018-03-03 DIAGNOSIS — E119 Type 2 diabetes mellitus without complications: Secondary | ICD-10-CM | POA: Diagnosis not present

## 2018-04-21 DIAGNOSIS — G3183 Dementia with Lewy bodies: Secondary | ICD-10-CM | POA: Diagnosis not present

## 2018-04-21 DIAGNOSIS — F0280 Dementia in other diseases classified elsewhere without behavioral disturbance: Secondary | ICD-10-CM | POA: Diagnosis not present

## 2018-05-02 DIAGNOSIS — E119 Type 2 diabetes mellitus without complications: Secondary | ICD-10-CM | POA: Diagnosis not present

## 2018-05-02 DIAGNOSIS — K8051 Calculus of bile duct without cholangitis or cholecystitis with obstruction: Secondary | ICD-10-CM | POA: Diagnosis not present

## 2018-05-02 DIAGNOSIS — I4892 Unspecified atrial flutter: Secondary | ICD-10-CM | POA: Diagnosis not present

## 2018-05-02 DIAGNOSIS — J449 Chronic obstructive pulmonary disease, unspecified: Secondary | ICD-10-CM | POA: Diagnosis not present

## 2018-05-02 DIAGNOSIS — N400 Enlarged prostate without lower urinary tract symptoms: Secondary | ICD-10-CM | POA: Diagnosis not present

## 2018-05-02 DIAGNOSIS — E1142 Type 2 diabetes mellitus with diabetic polyneuropathy: Secondary | ICD-10-CM | POA: Diagnosis not present

## 2018-05-02 DIAGNOSIS — I10 Essential (primary) hypertension: Secondary | ICD-10-CM | POA: Diagnosis not present

## 2018-05-02 DIAGNOSIS — G20 Parkinson's disease: Secondary | ICD-10-CM | POA: Diagnosis not present

## 2018-05-02 DIAGNOSIS — Z794 Long term (current) use of insulin: Secondary | ICD-10-CM | POA: Diagnosis not present

## 2018-05-02 DIAGNOSIS — K8050 Calculus of bile duct without cholangitis or cholecystitis without obstruction: Secondary | ICD-10-CM | POA: Diagnosis not present

## 2018-07-07 DIAGNOSIS — E1165 Type 2 diabetes mellitus with hyperglycemia: Secondary | ICD-10-CM | POA: Diagnosis not present

## 2018-07-07 DIAGNOSIS — E1142 Type 2 diabetes mellitus with diabetic polyneuropathy: Secondary | ICD-10-CM | POA: Diagnosis not present

## 2018-07-07 DIAGNOSIS — G20 Parkinson's disease: Secondary | ICD-10-CM | POA: Diagnosis not present

## 2018-07-07 DIAGNOSIS — I10 Essential (primary) hypertension: Secondary | ICD-10-CM | POA: Diagnosis not present

## 2018-07-07 DIAGNOSIS — R2689 Other abnormalities of gait and mobility: Secondary | ICD-10-CM | POA: Diagnosis not present

## 2018-07-22 DIAGNOSIS — G3183 Dementia with Lewy bodies: Secondary | ICD-10-CM | POA: Diagnosis not present

## 2018-07-22 DIAGNOSIS — F0280 Dementia in other diseases classified elsewhere without behavioral disturbance: Secondary | ICD-10-CM | POA: Diagnosis not present

## 2018-08-17 DIAGNOSIS — Z23 Encounter for immunization: Secondary | ICD-10-CM | POA: Diagnosis not present

## 2018-09-09 DIAGNOSIS — G3183 Dementia with Lewy bodies: Secondary | ICD-10-CM | POA: Diagnosis not present

## 2018-09-09 DIAGNOSIS — F0280 Dementia in other diseases classified elsewhere without behavioral disturbance: Secondary | ICD-10-CM | POA: Diagnosis not present

## 2018-09-15 DIAGNOSIS — R2689 Other abnormalities of gait and mobility: Secondary | ICD-10-CM | POA: Diagnosis not present

## 2018-09-21 DIAGNOSIS — G3183 Dementia with Lewy bodies: Secondary | ICD-10-CM | POA: Diagnosis not present

## 2018-09-21 DIAGNOSIS — F0280 Dementia in other diseases classified elsewhere without behavioral disturbance: Secondary | ICD-10-CM | POA: Diagnosis not present

## 2018-10-21 DIAGNOSIS — E119 Type 2 diabetes mellitus without complications: Secondary | ICD-10-CM | POA: Diagnosis not present

## 2018-10-21 DIAGNOSIS — G20 Parkinson's disease: Secondary | ICD-10-CM | POA: Diagnosis not present

## 2018-10-21 DIAGNOSIS — R52 Pain, unspecified: Secondary | ICD-10-CM | POA: Diagnosis not present

## 2018-10-21 DIAGNOSIS — M5489 Other dorsalgia: Secondary | ICD-10-CM | POA: Diagnosis not present

## 2018-10-21 DIAGNOSIS — I10 Essential (primary) hypertension: Secondary | ICD-10-CM | POA: Diagnosis not present

## 2018-10-21 DIAGNOSIS — S3992XA Unspecified injury of lower back, initial encounter: Secondary | ICD-10-CM | POA: Diagnosis not present

## 2018-10-21 DIAGNOSIS — E1165 Type 2 diabetes mellitus with hyperglycemia: Secondary | ICD-10-CM | POA: Diagnosis not present

## 2018-10-21 DIAGNOSIS — Z79891 Long term (current) use of opiate analgesic: Secondary | ICD-10-CM | POA: Diagnosis not present

## 2018-10-21 DIAGNOSIS — E7800 Pure hypercholesterolemia, unspecified: Secondary | ICD-10-CM | POA: Diagnosis not present

## 2018-10-21 DIAGNOSIS — R296 Repeated falls: Secondary | ICD-10-CM | POA: Diagnosis not present

## 2018-10-21 DIAGNOSIS — S4991XA Unspecified injury of right shoulder and upper arm, initial encounter: Secondary | ICD-10-CM | POA: Diagnosis not present

## 2018-10-21 DIAGNOSIS — G8929 Other chronic pain: Secondary | ICD-10-CM | POA: Diagnosis not present

## 2018-10-21 DIAGNOSIS — M545 Low back pain: Secondary | ICD-10-CM | POA: Diagnosis not present

## 2018-10-21 DIAGNOSIS — M25511 Pain in right shoulder: Secondary | ICD-10-CM | POA: Diagnosis not present

## 2020-02-08 ENCOUNTER — Other Ambulatory Visit: Payer: Self-pay

## 2020-02-08 ENCOUNTER — Inpatient Hospital Stay (HOSPITAL_COMMUNITY)
Admission: EM | Admit: 2020-02-08 | Discharge: 2020-02-10 | DRG: 637 | Disposition: A | Discharge status: Home Health | Payer: Medicare Other | Attending: Internal Medicine | Admitting: Internal Medicine

## 2020-02-08 ENCOUNTER — Encounter (HOSPITAL_COMMUNITY): Payer: Self-pay

## 2020-02-08 DIAGNOSIS — W19XXXA Unspecified fall, initial encounter: Secondary | ICD-10-CM

## 2020-02-08 DIAGNOSIS — R55 Syncope and collapse: Secondary | ICD-10-CM

## 2020-02-08 DIAGNOSIS — Z931 Gastrostomy status: Secondary | ICD-10-CM

## 2020-02-08 DIAGNOSIS — I10 Essential (primary) hypertension: Secondary | ICD-10-CM

## 2020-02-08 DIAGNOSIS — Z794 Long term (current) use of insulin: Secondary | ICD-10-CM | POA: Diagnosis not present

## 2020-02-08 DIAGNOSIS — E11649 Type 2 diabetes mellitus with hypoglycemia without coma: Principal | ICD-10-CM | POA: Diagnosis present

## 2020-02-08 DIAGNOSIS — N400 Enlarged prostate without lower urinary tract symptoms: Secondary | ICD-10-CM | POA: Diagnosis present

## 2020-02-08 DIAGNOSIS — E785 Hyperlipidemia, unspecified: Secondary | ICD-10-CM | POA: Diagnosis present

## 2020-02-08 DIAGNOSIS — Z6820 Body mass index (BMI) 20.0-20.9, adult: Secondary | ICD-10-CM

## 2020-02-08 DIAGNOSIS — Z79899 Other long term (current) drug therapy: Secondary | ICD-10-CM

## 2020-02-08 DIAGNOSIS — E162 Hypoglycemia, unspecified: Secondary | ICD-10-CM | POA: Diagnosis present

## 2020-02-08 DIAGNOSIS — R509 Fever, unspecified: Secondary | ICD-10-CM

## 2020-02-08 DIAGNOSIS — R630 Anorexia: Secondary | ICD-10-CM | POA: Diagnosis present

## 2020-02-08 DIAGNOSIS — Z9181 History of falling: Secondary | ICD-10-CM

## 2020-02-08 DIAGNOSIS — G20 Parkinson's disease: Secondary | ICD-10-CM | POA: Diagnosis not present

## 2020-02-08 DIAGNOSIS — R1310 Dysphagia, unspecified: Secondary | ICD-10-CM | POA: Diagnosis not present

## 2020-02-08 DIAGNOSIS — Z7982 Long term (current) use of aspirin: Secondary | ICD-10-CM

## 2020-02-08 DIAGNOSIS — D649 Anemia, unspecified: Secondary | ICD-10-CM | POA: Diagnosis not present

## 2020-02-08 DIAGNOSIS — E119 Type 2 diabetes mellitus without complications: Secondary | ICD-10-CM

## 2020-02-08 DIAGNOSIS — Z87891 Personal history of nicotine dependence: Secondary | ICD-10-CM

## 2020-02-08 DIAGNOSIS — I2510 Atherosclerotic heart disease of native coronary artery without angina pectoris: Secondary | ICD-10-CM | POA: Diagnosis not present

## 2020-02-08 DIAGNOSIS — Z20822 Contact with and (suspected) exposure to covid-19: Secondary | ICD-10-CM | POA: Diagnosis not present

## 2020-02-08 DIAGNOSIS — J690 Pneumonitis due to inhalation of food and vomit: Secondary | ICD-10-CM | POA: Diagnosis not present

## 2020-02-08 HISTORY — DX: Type 2 diabetes mellitus without complications: E11.9

## 2020-02-08 LAB — CBC WITH DIFFERENTIAL/PLATELET
Abs Immature Granulocytes: 0.06 10*3/uL (ref 0.00–0.07)
Basophils Absolute: 0 10*3/uL (ref 0.0–0.1)
Basophils Relative: 0 %
Eosinophils Absolute: 0 10*3/uL (ref 0.0–0.5)
Eosinophils Relative: 0 %
HCT: 37.3 % — ABNORMAL LOW (ref 39.0–52.0)
Hemoglobin: 11.6 g/dL — ABNORMAL LOW (ref 13.0–17.0)
Immature Granulocytes: 1 %
Lymphocytes Relative: 4 %
Lymphs Abs: 0.4 10*3/uL — ABNORMAL LOW (ref 0.7–4.0)
MCH: 27 pg (ref 26.0–34.0)
MCHC: 31.1 g/dL (ref 30.0–36.0)
MCV: 86.9 fL (ref 80.0–100.0)
Monocytes Absolute: 0.8 10*3/uL (ref 0.1–1.0)
Monocytes Relative: 8 %
Neutro Abs: 9.6 10*3/uL — ABNORMAL HIGH (ref 1.7–7.7)
Neutrophils Relative %: 87 %
Platelets: 196 10*3/uL (ref 150–400)
RBC: 4.29 MIL/uL (ref 4.22–5.81)
RDW: 13.7 % (ref 11.5–15.5)
WBC: 10.9 10*3/uL — ABNORMAL HIGH (ref 4.0–10.5)
nRBC: 0 % (ref 0.0–0.2)

## 2020-02-08 LAB — BASIC METABOLIC PANEL
Anion gap: 10 (ref 5–15)
BUN: 15 mg/dL (ref 8–23)
CO2: 24 mmol/L (ref 22–32)
Calcium: 8.6 mg/dL — ABNORMAL LOW (ref 8.9–10.3)
Chloride: 102 mmol/L (ref 98–111)
Creatinine, Ser: 0.46 mg/dL — ABNORMAL LOW (ref 0.61–1.24)
GFR calc Af Amer: >60 mL/min (ref >60)
GFR calc non Af Amer: >60 mL/min (ref >60)
Glucose, Bld: 72 mg/dL (ref 70–99)
Potassium: 4 mmol/L (ref 3.5–5.1)
Sodium: 136 mmol/L (ref 135–145)

## 2020-02-08 LAB — GLUCOSE, CAPILLARY: Glucose-Capillary: 86 mg/dL (ref 70–99)

## 2020-02-08 LAB — CBG MONITORING, ED
Glucose-Capillary: 49 mg/dL — ABNORMAL LOW (ref 70–99)
Glucose-Capillary: 57 mg/dL — ABNORMAL LOW (ref 70–99)
Glucose-Capillary: 66 mg/dL — ABNORMAL LOW (ref 70–99)
Glucose-Capillary: 85 mg/dL (ref 70–99)

## 2020-02-08 MED ORDER — DEXTROSE 10 % IV SOLN: INTRAVENOUS | Status: DC

## 2020-02-08 MED ORDER — DEXTROSE-NACL 5-0.9 % IV SOLN: INTRAVENOUS | Status: DC

## 2020-02-08 NOTE — ED Triage Notes (Signed)
Pt bib [facility] ems w/ c/o hypoglycemia. Initial CBG 23, pt received approx D10 with EMS and CBG up to 202, however most recent CBG back down to 78. All other EMS VSS. Pt w/ hx of parkinsons, AOx3 at baseline.

## 2020-02-08 NOTE — ED Provider Notes (Signed)
[place] EMERGENCY DEPARTMENT Provider Note   CSN: [id number] Arrival date & time: 02/08/20  1927     History Chief Complaint  Patient presents with  . Hypoglycemia    Joseph Mckay is a 64 y.o. male.  Presents ER with concern for low blood sugar.  Patient does not recall events well from today.  Order to history obtained from patient's wife.  Patient had been acting appropriately normal this morning, this afternoon she found him unresponsive, sugar 30, EMS was called and they found patient have sugar of 20, when patient left house, was unresponsive.  EMS provided patient with D10 infusion, patient had return of consciousness.  At this time patient has no acute complaints.  Patient has been receiving 50 units of Levemir every morning, wife reports difficulty with p.o. intake due to his Parkinson's, dysphagia, currently expecting G-tube placement with [facility] within the next week or 2 as outpatient for better nutrition.  HPI     History reviewed. No pertinent past medical history.  There are no problems to display for this patient.   History reviewed. No pertinent surgical history.     History reviewed. No pertinent family history.  Social History   Tobacco Use  . Smoking status: Not on file  Substance Use Topics  . Alcohol use: Not on file  . Drug use: Not on file    Home Medications Prior to Admission medications   Not on File    Allergies    Patient has no allergy information on record.  Review of Systems   Review of Systems  Unable to perform ROS: Mental status change    Physical Exam Updated Vital Signs BP (!) 168/105   Resp (!) 24   Physical Exam Vitals and nursing note reviewed.  Constitutional:      Appearance: He is well-developed.  HENT:     Head: Normocephalic and atraumatic.  Eyes:     Conjunctiva/sclera: Conjunctivae normal.  Cardiovascular:     Rate and Rhythm: Normal rate and regular rhythm.     Heart sounds:  No murmur.  Pulmonary:     Effort: Pulmonary effort is normal. No respiratory distress.     Breath sounds: Normal breath sounds.  Abdominal:     Palpations: Abdomen is soft.     Tenderness: There is no abdominal tenderness.  Musculoskeletal:        General: No deformity or signs of injury.     Cervical back: Neck supple.  Skin:    General: Skin is warm and dry.  Neurological:     Mental Status: He is alert.     Comments: Mildly lethargic but easily aroused, oriented to person and place and time     ED Results / Procedures / Treatments   Labs (all labs ordered are listed, but only abnormal results are displayed) Labs Reviewed  CBC WITH DIFFERENTIAL/PLATELET - Abnormal; Notable for the following components:      Result Value   WBC 10.9 (*)    Hemoglobin 11.6 (*)    HCT 37.3 (*)    Neutro Abs 9.6 (*)    Lymphs Abs 0.4 (*)    All other components within normal limits  BASIC METABOLIC PANEL - Abnormal; Notable for the following components:   Creatinine, Ser 0.46 (*)    Calcium 8.6 (*)    All other components within normal limits  CBG MONITORING, ED - Abnormal; Notable for the following components:   Glucose-Capillary 49 (*)  All other components within normal limits  CBG MONITORING, ED - Abnormal; Notable for the following components:   Glucose-Capillary 57 (*)    All other components within normal limits    EKG None  Radiology No results found.  Procedures .Critical Care Performed by: Milagros Loll, MD Authorized by: Milagros Loll, MD   Critical care provider statement:    Critical care time (minutes):  35   Critical care was time spent personally by me on the following activities:  Discussions with consultants, evaluation of patient's response to treatment, examination of patient, ordering and performing treatments and interventions, ordering and review of laboratory studies, ordering and review of radiographic studies, pulse oximetry, re-evaluation of  patient's condition, obtaining history from patient or surrogate and review of old charts   (including critical care time)  Medications Ordered in ED Medications - No data to display  ED Course  I have reviewed the triage vital signs and the nursing notes.  Pertinent labs & imaging results that were available during my care of the patient were reviewed by me and considered in my medical decision making (see chart for details).  Clinical Course as of Feb 08 2352  Thu Feb 08, 2020  2027 D/w Levemir, metformin - poor appetite, currently on 50 units   [RD]    Clinical Course User Index [RD] Milagros Loll, MD   MDM Rules/Calculators/A&P                      64 year old male who presented to ER with concern for unresponsive episode, low blood sugar.  Given history, suspect this is related to patient's generally poor p.o. intake while remaining on same insulin regimen.  Patient was provided p.o. while in ER however struggled to keep his blood sugars within reasonable level by just p.o.  Started on dextrose infusion, will admit to the hospital service for further observation overnight.  Dr. Loney Loh accepting.   Final Clinical Impression(s) / ED Diagnoses Final diagnoses:  Hypoglycemia    Rx / DC Orders ED Discharge Orders    None       Milagros Loll, MD 02/08/20 2357

## 2020-02-08 NOTE — ED Notes (Signed)
Pt provided with more PO OJ and PO Graham Crackers to increase CBG further

## 2020-02-08 NOTE — ED Notes (Signed)
This RN provided this pt with orange juice to elevate CBG

## 2020-02-08 NOTE — ED Notes (Signed)
Joseph Mckay [id number] looking for an update

## 2020-02-08 NOTE — ED Notes (Signed)
Pt provided with more orange juice to increase CBG; VSS stable and pt is A&Ox4

## 2020-02-09 ENCOUNTER — Observation Stay (HOSPITAL_COMMUNITY): Payer: Medicare Other

## 2020-02-09 ENCOUNTER — Encounter (HOSPITAL_COMMUNITY): Payer: Self-pay | Admitting: Internal Medicine

## 2020-02-09 ENCOUNTER — Other Ambulatory Visit: Payer: Self-pay

## 2020-02-09 DIAGNOSIS — R1310 Dysphagia, unspecified: Secondary | ICD-10-CM | POA: Diagnosis not present

## 2020-02-09 DIAGNOSIS — R509 Fever, unspecified: Secondary | ICD-10-CM

## 2020-02-09 DIAGNOSIS — E11649 Type 2 diabetes mellitus with hypoglycemia without coma: Secondary | ICD-10-CM | POA: Diagnosis present

## 2020-02-09 DIAGNOSIS — E162 Hypoglycemia, unspecified: Secondary | ICD-10-CM

## 2020-02-09 DIAGNOSIS — R630 Anorexia: Secondary | ICD-10-CM | POA: Diagnosis present

## 2020-02-09 DIAGNOSIS — Z7982 Long term (current) use of aspirin: Secondary | ICD-10-CM | POA: Diagnosis not present

## 2020-02-09 DIAGNOSIS — Z9181 History of falling: Secondary | ICD-10-CM | POA: Diagnosis not present

## 2020-02-09 DIAGNOSIS — I10 Essential (primary) hypertension: Secondary | ICD-10-CM | POA: Diagnosis not present

## 2020-02-09 DIAGNOSIS — Z20822 Contact with and (suspected) exposure to covid-19: Secondary | ICD-10-CM | POA: Diagnosis not present

## 2020-02-09 DIAGNOSIS — G20 Parkinson's disease: Secondary | ICD-10-CM | POA: Diagnosis not present

## 2020-02-09 DIAGNOSIS — E785 Hyperlipidemia, unspecified: Secondary | ICD-10-CM | POA: Diagnosis present

## 2020-02-09 DIAGNOSIS — E119 Type 2 diabetes mellitus without complications: Secondary | ICD-10-CM

## 2020-02-09 DIAGNOSIS — J690 Pneumonitis due to inhalation of food and vomit: Secondary | ICD-10-CM | POA: Diagnosis present

## 2020-02-09 DIAGNOSIS — Z6820 Body mass index (BMI) 20.0-20.9, adult: Secondary | ICD-10-CM | POA: Diagnosis not present

## 2020-02-09 DIAGNOSIS — Z79899 Other long term (current) drug therapy: Secondary | ICD-10-CM | POA: Diagnosis not present

## 2020-02-09 DIAGNOSIS — N400 Enlarged prostate without lower urinary tract symptoms: Secondary | ICD-10-CM | POA: Diagnosis not present

## 2020-02-09 DIAGNOSIS — Z87891 Personal history of nicotine dependence: Secondary | ICD-10-CM | POA: Diagnosis not present

## 2020-02-09 DIAGNOSIS — D649 Anemia, unspecified: Secondary | ICD-10-CM | POA: Diagnosis present

## 2020-02-09 DIAGNOSIS — Z794 Long term (current) use of insulin: Secondary | ICD-10-CM | POA: Diagnosis not present

## 2020-02-09 DIAGNOSIS — I2510 Atherosclerotic heart disease of native coronary artery without angina pectoris: Secondary | ICD-10-CM | POA: Diagnosis present

## 2020-02-09 LAB — URINALYSIS, ROUTINE W REFLEX MICROSCOPIC
Bilirubin Urine: NEGATIVE
Glucose, UA: NEGATIVE mg/dL
Hgb urine dipstick: NEGATIVE
Ketones, ur: NEGATIVE mg/dL
Leukocytes,Ua: NEGATIVE
Nitrite: NEGATIVE
Protein, ur: NEGATIVE mg/dL
Specific Gravity, Urine: 1.012 (ref 1.005–1.030)
pH: 8 (ref 5.0–8.0)

## 2020-02-09 LAB — CBC
HCT: 36 % — ABNORMAL LOW (ref 39.0–52.0)
Hemoglobin: 11.8 g/dL — ABNORMAL LOW (ref 13.0–17.0)
MCH: 27.6 pg (ref 26.0–34.0)
MCHC: 32.8 g/dL (ref 30.0–36.0)
MCV: 84.1 fL (ref 80.0–100.0)
Platelets: 199 10*3/uL (ref 150–400)
RBC: 4.28 MIL/uL (ref 4.22–5.81)
RDW: 13.5 % (ref 11.5–15.5)
WBC: 8.1 10*3/uL (ref 4.0–10.5)
nRBC: 0 % (ref 0.0–0.2)

## 2020-02-09 LAB — GLUCOSE, CAPILLARY
Glucose-Capillary: 104 mg/dL — ABNORMAL HIGH (ref 70–99)
Glucose-Capillary: 106 mg/dL — ABNORMAL HIGH (ref 70–99)
Glucose-Capillary: 145 mg/dL — ABNORMAL HIGH (ref 70–99)
Glucose-Capillary: 177 mg/dL — ABNORMAL HIGH (ref 70–99)
Glucose-Capillary: 347 mg/dL — ABNORMAL HIGH (ref 70–99)
Glucose-Capillary: 361 mg/dL — ABNORMAL HIGH (ref 70–99)
Glucose-Capillary: 385 mg/dL — ABNORMAL HIGH (ref 70–99)
Glucose-Capillary: 94 mg/dL (ref 70–99)

## 2020-02-09 LAB — FOLATE: Folate: 21 ng/mL (ref >5.9)

## 2020-02-09 LAB — TROPONIN I (HIGH SENSITIVITY): Troponin I (High Sensitivity): 7 ng/L (ref <18)

## 2020-02-09 LAB — IRON AND TIBC
Iron: 13 ug/dL — ABNORMAL LOW (ref 45–182)
Saturation Ratios: 6 % — ABNORMAL LOW (ref 17.9–39.5)
TIBC: 207 ug/dL — ABNORMAL LOW (ref 250–450)
UIBC: 194 ug/dL

## 2020-02-09 LAB — HEMOGLOBIN A1C
Hgb A1c MFr Bld: 7.9 % — ABNORMAL HIGH (ref 4.8–5.6)
Mean Plasma Glucose: 180.03 mg/dL

## 2020-02-09 LAB — RETICULOCYTES
Immature Retic Fract: 7 % (ref 2.3–15.9)
RBC.: 4.21 MIL/uL — ABNORMAL LOW (ref 4.22–5.81)
Retic Count, Absolute: 46.7 10*3/uL (ref 19.0–186.0)
Retic Ct Pct: 1.1 % (ref 0.4–3.1)

## 2020-02-09 LAB — HIV ANTIBODY (ROUTINE TESTING W REFLEX): HIV Screen 4th Generation wRfx: NONREACTIVE

## 2020-02-09 LAB — VITAMIN B12: Vitamin B-12: 267 pg/mL (ref 180–914)

## 2020-02-09 LAB — FERRITIN: Ferritin: 122 ng/mL (ref 24–336)

## 2020-02-09 LAB — SARS CORONAVIRUS 2 (TAT 6-24 HRS): SARS Coronavirus 2: NEGATIVE

## 2020-02-09 LAB — LACTIC ACID, PLASMA: Lactic Acid, Venous: 1.1 mmol/L (ref 0.5–1.9)

## 2020-02-09 MED ORDER — LISINOPRIL 10 MG PO TABS
20.0000 mg | ORAL_TABLET | Freq: Every day | ORAL | Status: DC
  Administered 2020-02-09 – 2020-02-10 (×2): 20 mg via ORAL
  Filled 2020-02-09 (×2): qty 2

## 2020-02-09 MED ORDER — ACETAMINOPHEN 325 MG PO TABS: 650.0000 mg | ORAL_TABLET | Freq: Four times a day (QID) | ORAL | Status: DC | PRN

## 2020-02-09 MED ORDER — ENOXAPARIN SODIUM 40 MG/0.4ML ~~LOC~~ SOLN
40.0000 mg | SUBCUTANEOUS | Status: DC
  Administered 2020-02-09: 40 mg via SUBCUTANEOUS
  Filled 2020-02-09: qty 0.4

## 2020-02-09 MED ORDER — SODIUM CHLORIDE 0.9 % IV SOLN
3.0000 g | Freq: Four times a day (QID) | INTRAVENOUS | Status: DC
  Administered 2020-02-09 – 2020-02-10 (×5): 3 g via INTRAVENOUS
  Filled 2020-02-09 (×2): qty 8
  Filled 2020-02-09 (×2): qty 3
  Filled 2020-02-09 (×3): qty 8
  Filled 2020-02-09: qty 3

## 2020-02-09 MED ORDER — HYDRALAZINE HCL 20 MG/ML IJ SOLN: 5.0000 mg | INTRAMUSCULAR | Status: DC | PRN

## 2020-02-09 MED ORDER — HYDRALAZINE HCL 20 MG/ML IJ SOLN: 10.0000 mg | INTRAMUSCULAR | Status: DC | PRN

## 2020-02-09 MED ORDER — RESOURCE THICKENUP CLEAR PO POWD
ORAL | Status: DC | PRN
  Filled 2020-02-09: qty 125

## 2020-02-09 MED ORDER — TAMSULOSIN HCL 0.4 MG PO CAPS
0.8000 mg | ORAL_CAPSULE | Freq: Every day | ORAL | Status: DC
  Administered 2020-02-09: 0.8 mg via ORAL
  Filled 2020-02-09: qty 2

## 2020-02-09 MED ORDER — DOCUSATE SODIUM 100 MG PO CAPS
100.0000 mg | ORAL_CAPSULE | Freq: Two times a day (BID) | ORAL | Status: DC
  Administered 2020-02-09 – 2020-02-10 (×2): 100 mg via ORAL
  Filled 2020-02-09 (×3): qty 1

## 2020-02-09 MED ORDER — CARBIDOPA-LEVODOPA 25-100 MG PO TABS
1.0000 | ORAL_TABLET | Freq: Three times a day (TID) | ORAL | Status: DC
  Administered 2020-02-09 – 2020-02-10 (×4): 1 via ORAL
  Filled 2020-02-09 (×6): qty 1

## 2020-02-09 MED ORDER — POLYETHYLENE GLYCOL 3350 17 G PO PACK: 17.0000 g | PACK | Freq: Every day | ORAL | Status: DC

## 2020-02-09 MED ORDER — ACETAMINOPHEN 650 MG RE SUPP: 650.0000 mg | Freq: Four times a day (QID) | RECTAL | Status: DC | PRN

## 2020-02-09 MED ORDER — INSULIN ASPART 100 UNIT/ML ~~LOC~~ SOLN
0.0000 [IU] | SUBCUTANEOUS | Status: DC
  Administered 2020-02-09: 4 [IU] via SUBCUTANEOUS
  Administered 2020-02-09: 5 [IU] via SUBCUTANEOUS
  Administered 2020-02-10: 1 [IU] via SUBCUTANEOUS
  Administered 2020-02-10: 2 [IU] via SUBCUTANEOUS
  Administered 2020-02-10: 1 [IU] via SUBCUTANEOUS

## 2020-02-09 MED ORDER — ASPIRIN EC 81 MG PO TBEC
81.0000 mg | DELAYED_RELEASE_TABLET | Freq: Every day | ORAL | Status: DC
  Administered 2020-02-09 – 2020-02-10 (×2): 81 mg via ORAL
  Filled 2020-02-09 (×2): qty 1

## 2020-02-09 MED ORDER — ATORVASTATIN CALCIUM 40 MG PO TABS
40.0000 mg | ORAL_TABLET | Freq: Every day | ORAL | Status: DC
  Administered 2020-02-10: 40 mg via ORAL
  Filled 2020-02-09 (×2): qty 1

## 2020-02-09 NOTE — Progress Notes (Addendum)
  PROGRESS NOTE  Patient admitted earlier this morning. See H&P.   Joseph Mckay is a 64 y.o. male with medical history significant of Parkinson's disease, dysphagia, insulin-dependent type 2 diabetes, BPH, hypertension, hyperlipidemia presenting to the ED via EMS for evaluation of hypoglycemia. Wife reported to ED provider that patient was acting normally this morning but in the afternoon she found him unresponsive with blood sugar of 30.  EMS was called and an initial CBG was in the 20s.  Patient was given D10 infusion with return of consciousness.  Wife reported that patient has been receiving 50 units of Levemir every morning and that he has had poor oral intake due to his Parkinson's, dysphagia, and currently expecting G-tube placement with [facility] within the next 2 weeks as outpatient for better nutrition.  Patient's blood sugar improved since admission.  Patient is alert, awake this morning.  Reports that he has no appetite.  He has no other physical complaints today.  I discussed over the phone with his wife and he is planned to get a feeding tube placement with Dr. Boyd Kerbs at [facility] on 3/18.  A/P:  Hypoglycemia in the setting of insulin-dependent type 2 diabetes: -Suspect hypoglycemia is due to ongoing insulin use in the setting of poor oral intake -Would recommend to stop long-acting insulin until he is able to get a feeding tube placement with consistent intake -Hypoglycemia resolved  Syncope/unresponsiveness -Suspect related to severe hypoglycemia  -CT head: No evidence of intracranial injury -Resolved  Aspiration pneumonia, present on admission without sepsis -Chest x-ray: Right lung base pneumonia, Followup PA and lateral chest X-ray is recommended in 3-4 weeks following trial of antibiotic therapy to ensure resolution and exclude underlying malignancy. -Unasyn  Recent fall -Left hip xray: No acute finding. -PT OT  History of dysphagia -SLP  eval  Hypertension -Continue lisinopril  Parkinson's disease -Continue sinemet   CAD -Continue aspirin, lipitor    Dispo: Started on IV antibiotics for aspiration pneumonia. Remains on D10, will continue to monitor CBGs. Will need PT OT SLP eval prior to discharge. Hopeful discharge home with wife 3/13. Called and discussed with wife over the phone this morning.    Noralee Stain, DO [facility] 02/09/2020, 9:54 AM  Available via Epic secure chat 7am-7pm After these hours, please refer to coverage provider listed on [email or web address]

## 2020-02-09 NOTE — Evaluation (Signed)
Occupational Therapy Evaluation Patient Details Name: Joseph PELOQUIN MRN: [id number] DOB: Jan 27, 1956 Today's Date: 02/09/2020    History of Present Illness 64 y.o. male with medical history significant of Parkinson's disease, dysphagia, insulin-dependent type 2 diabetes, BPH, hypertension, hyperlipidemia presenting to the ED via EMS for evaluation of hypoglycemia.    Clinical Impression   Patient is a 64 year old male that lives with spouse in single level home with 7 STE front, 5 STE back. Patient reports spouse assists with ADLs and functional transfers at baseline. Patient uses rollator for ambulation for household distances, also has wheelchair. Currently patient require mod A for sit to stand from EOB and min A to transfer to recliner with x1 instance of LEs scissoring. Patient require min/mod A to initiate eating, OT provide built up foam handles for silverware. Patient having difficulty maintaining upright posture leaning to the R, OT attempt to reposition with pillows. Will continue to follow.    Follow Up Recommendations  Outpatient OT;Supervision/Assistance - 24 hour;Home health OT;Other (comment)(OP for w/c assessment, HH if spouse cannot transport to OP)    Equipment Recommendations  Other (comment)(wheelchair adaptation assessment )       Precautions / Restrictions Precautions Precautions: Fall Restrictions Weight Bearing Restrictions: No      Mobility Bed Mobility Overal bed mobility: Needs Assistance Bed Mobility: Supine to Sit     Supine to sit: Mod assist     General bed mobility comments: for trunk support/elevation   Transfers Overall transfer level: Needs assistance Equipment used: Rolling walker (2 wheeled) Transfers: Sit to/from Stand Sit to Stand: Mod assist         General transfer comment: mod A to power up to standing     Balance Overall balance assessment: Needs assistance;History of Falls Sitting-balance support: Bilateral upper extremity  supported;Feet supported Sitting balance-Leahy Scale: Fair Sitting balance - Comments: close supervision   Standing balance support: Bilateral upper extremity supported;During functional activity Standing balance-Leahy Scale: Poor Standing balance comment: reliant on B UE                           ADL either performed or assessed with clinical judgement   ADL Overall ADL's : Needs assistance/impaired Eating/Feeding: Minimal assistance;Moderate assistance;Sitting Eating/Feeding Details (indicate cue type and reason): patient having difficulty maintaining upright posture in chair, OT reposition with pillows. patient also having difficulty with hand to mouth excursion. OT provide min/mod A for bite of oatmeal. OT then provide patient with rubber built up handles Grooming: Moderate assistance;Maximal assistance;Sitting   Upper Body Bathing: Maximal assistance;Sitting;Moderate assistance   Lower Body Bathing: Total assistance;Sitting/lateral leans;Sit to/from stand   Upper Body Dressing : Maximal assistance;Sitting;Moderate assistance   Lower Body Dressing: Total assistance;Sitting/lateral leans;Sit to/from stand   Toilet Transfer: Moderate assistance;Cueing for safety;Ambulation;BSC;RW Toilet Transfer Details (indicate cue type and reason): simulated with transfer to recliner, patient mod A to stand from edge of bed, min A to take few steps with x1 scissoring turning to sit in recliner Toileting- Clothing Manipulation and Hygiene: Total assistance;Sitting/lateral lean;Sit to/from stand       Functional mobility during ADLs: Minimal assistance;Moderate assistance;Rolling walker;Cueing for safety General ADL Comments: unsure of precise level of assist spouse provided for ADLs, however patient reports he is close to his baseline for mobility                  Pertinent Vitals/Pain Pain Assessment: Faces Faces Pain Scale: Hurts little more Pain  Location: buttocks Pain  Descriptors / Indicators: Discomfort;Aching Pain Intervention(s): Monitored during session     Hand Dominance Right   Extremity/Trunk Assessment Upper Extremity Assessment Upper Extremity Assessment: Generalized weakness   Lower Extremity Assessment Lower Extremity Assessment: Defer to PT evaluation   Cervical / Trunk Assessment Cervical / Trunk Assessment: Kyphotic   Communication Communication Communication: Expressive difficulties;Other (comment)(difficult to understand at times due to garbled speech)   Cognition Arousal/Alertness: Awake/alert Behavior During Therapy: WFL for tasks assessed/performed Overall Cognitive Status: Within Functional Limits for tasks assessed                                 General Comments: answers all questions appropriately              Home Living Family/patient expects to be discharged to:: Private residence Living Arrangements: Spouse/significant other Available Help at Discharge: Family;Available PRN/intermittently Type of Home: House Home Access: Stairs to enter [employer] of Steps: 7 steps in front, 5 in back   Home Layout: One level     Bathroom Shower/Tub: Occupational psychologist: Standard     Home Equipment: Environmental consultant - 4 wheels;Bedside commode;Shower seat;Wheelchair - manual   Additional Comments: patient sleeps mostly in a recliner      Prior Functioning/Environment Level of Independence: Needs assistance  Gait / Transfers Assistance Needed: pt reports using 4WW for household ambulation, reports spouse helps "quite a bit" for transfers  ADL's / Homemaking Assistance Needed: spouse assists with self care, reports using adaptive utensils to feed himself            OT Problem List: Decreased strength;Decreased activity tolerance;Impaired balance (sitting and/or standing);Decreased safety awareness      OT Treatment/Interventions: Self-care/ADL training;DME and/or AE  instruction;Therapeutic activities;Patient/family education;Balance training    OT Goals(Current goals can be found in the care plan section) Acute Rehab OT Goals Patient Stated Goal: to get G tube next week OT Goal Formulation: With patient Time For Goal Achievement: 02/23/20 Potential to Achieve Goals: Good  OT Frequency: Min 2X/week    AM-PAC OT "6 Clicks" Daily Activity     Outcome Measure Help from another person eating meals?: A Lot Help from another person taking care of personal grooming?: A Lot Help from another person toileting, which includes using toliet, bedpan, or urinal?: A Lot Help from another person bathing (including washing, rinsing, drying)?: A Lot Help from another person to put on and taking off regular upper body clothing?: A Lot Help from another person to put on and taking off regular lower body clothing?: Total 6 Click Score: 11   End of Session Equipment Utilized During Treatment: Rolling walker  Activity Tolerance: Patient tolerated treatment well Patient left: in chair;with call bell/phone within reach;with chair alarm set  OT Visit Diagnosis: Unsteadiness on feet (R26.81);Other abnormalities of gait and mobility (R26.89);Muscle weakness (generalized) (M62.81);History of falling (Z91.81)                Time: 4196-2229 OT Time Calculation (min): 44 min Charges:  OT General Charges $OT Visit: 1 Visit OT Evaluation $OT Eval Moderate Complexity: 1 Mod OT Treatments $Self Care/Home Management : 23-37 mins  Shon Millet OT OT office: [place] 02/09/2020, 12:54 PM

## 2020-02-09 NOTE — Evaluation (Signed)
Physical Therapy Evaluation Patient Details Name: Joseph Mckay MRN: [id number] DOB: 08/08/1956 Today's Date: 02/09/2020   History of Present Illness  Patient is a 64 y.o. male who presents with hypoglycemia in the setting of insulin-dependent type 2 diabetes. PMH includes Parkinson's disease, dysphagia, insulin-dependent type 2 diabetes, BPH, hypertension, hyperlipidemia presenting to the ED via EMS for evaluation of hypoglycemia.  Clinical Impression  Patient presents with generalized weakness, impaired balance, decreased activity tolerance and impaired mobility s/p above. Pt reports needing assist with ADLs and standing at baseline from spouse and does short household distances with rollator. Reports multiple falls at home. Today, pt requires heavy Mod A to stand from low chair and min A to take a few steps to get to chair. Fatigues quickly after sitting in chair for a few hours. Also noted to favor right lateral trunk lean in all positions. If wife able to care for pt at home, recommend hospital bed and maximizing [facility] services. Noted to have multiple falls at home. If wife not able to provide level of care needed at home, recommend SNF. Would benefit from w/c assessment at some point. Will follow acutely to maximize independence and mobility prior to return home.     Follow Up Recommendations Home health PT;Supervision for mobility/OOB;Supervision/Assistance - 24 hour    Equipment Recommendations  Hospital bed;Wheelchair (measurements PT);Wheelchair cushion (measurements PT)  (customized w/c)   Recommendations for Other Services       Precautions / Restrictions Precautions Precautions: Fall Restrictions Weight Bearing Restrictions: No      Mobility  Bed Mobility Overal bed mobility: Needs Assistance Bed Mobility: Sit to Sidelying     Supine to sit: Mod assist   Sit to sidelying: Mod assist;HOB elevated General bed mobility comments: Assist to bring LEs into bed to return to  supine.  Transfers Overall transfer level: Needs assistance Equipment used: Rolling walker (2 wheeled) Transfers: Sit to/from Stand Sit to Stand: Mod assist         General transfer comment: Heavy mod A to stand from chair with cues for hand placement/technique.  Ambulation/Gait Ambulation/Gait assistance: Min assist Gait Distance (Feet): 3 Feet Assistive device: Rolling walker (2 wheeled) Gait Pattern/deviations: Trunk flexed;Decreased step length - right;Decreased step length - left;Shuffle Gait velocity: decreased   General Gait Details: Able to take a few steps to get to bed with Min A for RW management/safety.  Stairs            Wheelchair Mobility    Modified Rankin (Stroke Patients Only)       Balance Overall balance assessment: Needs assistance;History of Falls Sitting-balance support: Feet supported;Bilateral upper extremity supported Sitting balance-Leahy Scale: Fair Sitting balance - Comments: close supervision, right lateral trunk lean is position of preference.   Standing balance support: During functional activity Standing balance-Leahy Scale: Poor Standing balance comment: reliant on B UE and external support.                             Pertinent Vitals/Pain Pain Assessment: Faces Faces Pain Scale: Hurts a little bit Pain Location: buttocks Pain Descriptors / Indicators: Discomfort;Aching Pain Intervention(s): Repositioned;Monitored during session    Home Living Family/patient expects to be discharged to:: Private residence Living Arrangements: Spouse/significant other Available Help at Discharge: Family;Available PRN/intermittently Type of Home: House Home Access: Stairs to enter   [employer] of Steps: 7 steps in front, 5 in back Home Layout: One level Home Equipment: Gilford Rile -  4 wheels;Bedside commode;Shower seat;Wheelchair - manual Additional Comments: patient sleeps mostly in a recliner    Prior Function  Level of Independence: Needs assistance   Gait / Transfers Assistance Needed: pt reports using 4WW for household ambulation, reports spouse helps "quite a bit" for transfers. Reports multiple falls at home  ADL's / Homemaking Assistance Needed: spouse assists with self care, reports using adaptive utensils to feed himself        Hand Dominance   Dominant Hand: Right    Extremity/Trunk Assessment   Upper Extremity Assessment Upper Extremity Assessment: Defer to OT evaluation    Lower Extremity Assessment Lower Extremity Assessment: Generalized weakness    Cervical / Trunk Assessment Cervical / Trunk Assessment: Other exceptions Cervical / Trunk Exceptions: favors right lateral side bend  Communication   Communication: Expressive difficulties;Other (comment)(garbled speech at times)  Cognition Arousal/Alertness: Awake/alert Behavior During Therapy: WFL for tasks assessed/performed Overall Cognitive Status: Within Functional Limits for tasks assessed                                 General Comments: answers all questions appropriately      General Comments General comments (skin integrity, edema, etc.): VSS on RA.    Exercises     Assessment/Plan    PT Assessment Patient needs continued PT services  PT Problem List Decreased strength;Decreased mobility;Pain;Decreased balance;Decreased activity tolerance       PT Treatment Interventions Therapeutic activities;Gait training;Therapeutic exercise;Patient/family education;Wheelchair mobility training;Balance training;Functional mobility training;DME instruction    PT Goals (Current goals can be found in the Care Plan section)  Acute Rehab PT Goals Patient Stated Goal: to get G tube next week PT Goal Formulation: With patient Time For Goal Achievement: 02/23/20 Potential to Achieve Goals: Fair    Frequency Min 3X/week   Barriers to discharge Decreased caregiver support reports wife has chronic back  issues    Co-evaluation               AM-PAC PT "6 Clicks" Mobility  Outcome Measure Help needed turning from your back to your side while in a flat bed without using bedrails?: A Little Help needed moving from lying on your back to sitting on the side of a flat bed without using bedrails?: A Lot Help needed moving to and from a bed to a chair (including a wheelchair)?: A Lot Help needed standing up from a chair using your arms (e.g., wheelchair or bedside chair)?: A Lot Help needed to walk in hospital room?: A Little Help needed climbing 3-5 steps with a railing? : Total 6 Click Score: 13    End of Session Equipment Utilized During Treatment: Gait belt Activity Tolerance: Patient limited by fatigue Patient left: in bed;with call bell/phone within reach;with bed alarm set Nurse Communication: Mobility status PT Visit Diagnosis: Pain;Muscle weakness (generalized) (M62.81);Unsteadiness on feet (R26.81);Difficulty in walking, not elsewhere classified (R26.2) Pain - part of body: (buttocks)    Time: 1101-1116 PT Time Calculation (min) (ACUTE ONLY): 15 min   Charges:   PT Evaluation $PT Eval Moderate Complexity: 1 Mod          Vale Haven, PT, DPT Acute Rehabilitation Services Pager [phone] Office [phone]      Blake Divine A Kara Melching 02/09/2020, 1:50 PM

## 2020-02-09 NOTE — Evaluation (Signed)
Clinical/Bedside Swallow Evaluation Patient Details  Name: Joseph Mckay MRN: [id number] Date of Birth: 1956/07/22  Today's Date: 02/09/2020 Time: SLP Start Time (ACUTE ONLY): 1015 SLP Stop Time (ACUTE ONLY): 1040 SLP Time Calculation (min) (ACUTE ONLY): 25 min  Past Medical History:  Past Medical History:  Diagnosis Date  . Diabetes mellitus without complication [facility])    Past Surgical History: History reviewed. No pertinent surgical history. HPI:  Pt is a 64 y.o. male with medical history significant of Parkinson's disease, dysphagia, insulin-dependent type 2 diabetes, BPH, hypertension, hyperlipidemia presenting to the ED via EMS for evaluation of hypoglycemia. Chest x-ray: Right lung base pneumonia. Consider aspiration in the setting of syncope. Nursing reported coughing with breakfast. Per pt, he has been having difficulty swallowing and has been seeing an SLP for this. Pt reported that he has been having pureed foods with thickened liquids.    Assessment / Plan / Recommendation Clinical Impression  Pt was seen for bedside swallow evaluation and, with the pt's permission, his wife was contacted via phone to determine the pt's baseline. Pt reported having symptoms of dysphagia for the past year and indicated that he was having a puree diet with thickened liquids at home. His wife indicated that he had a modified barium swallow study 3-4 weeks prior and that he demonstrated aspiration of liquids via straw. She also stated that he was told to "swallow hard". Per the pt's wife, he was consuming softer foods at home and nectar thick liquids. However, his intake has been poor and the plan is for him to receive G-tube placed on 02/15/20. He demonstrated symptoms of oropharyngeal dysphagia characterized by prolonged mastication, multiple swallows with puree, and signs of aspiration with dysphagia 3 solids, thin liquids, and nectar thick liquids via straw. It is recommended that the pt's diet be  downgraded to dysphagia 1 (puree) solids and nectar thick liquids via cup only. SLP will follow to assess diet tolerance and determine whether a repeat swallow study is clinically indicated.  SLP Visit Diagnosis: Dysphagia, oropharyngeal phase (R13.12)    Aspiration Risk  Moderate aspiration risk;Mild aspiration risk    Diet Recommendation Dysphagia 1 (Puree);Nectar-thick liquid   Liquid Administration via: Cup;No straw Medication Administration: Crushed with puree Supervision: Intermittent supervision to cue for compensatory strategies Compensations: Small sips/bites;Slow rate;Effortful swallow Postural Changes: Seated upright at 90 degrees;Remain upright for at least 30 minutes after po intake    Other  Recommendations Oral Care Recommendations: Oral care BID Other Recommendations: Order thickener from pharmacy   Follow up Recommendations Home health SLP      Frequency and Duration min 2x/week  2 weeks       Prognosis Prognosis for Safe Diet Advancement: Fair Barriers to Reach Goals: Severity of deficits;Time post onset      Swallow Study   General Date of Onset: 02/09/19 HPI: Pt is a 64 y.o. male with medical history significant of Parkinson's disease, dysphagia, insulin-dependent type 2 diabetes, BPH, hypertension, hyperlipidemia presenting to the ED via EMS for evaluation of hypoglycemia. Chest x-ray: Right lung base pneumonia. Consider aspiration in the setting of syncope. Nursing reported coughing with breakfast. Per pt, he has been having difficulty swallowing and has been seeing an SLP for this. Pt reported that he has been having pureed foods with thickened liquids.  Type of Study: Bedside Swallow Evaluation Previous Swallow Assessment: Per wife: swallow study at [place] 3 weeks ago.Pt's wife reported that the pt  with aspiration of liquids via straw plan for feeding tube  to supplement PEG tube placement scheduled for 02/15/20 Diet Prior to this Study:  Regular;Nectar-thick liquids Temperature Spikes Noted: No Respiratory Status: Room air History of Recent Intubation: No Behavior/Cognition: Alert;Cooperative;Pleasant mood Oral Cavity Assessment: Within Functional Limits Oral Care Completed by SLP: No Oral Cavity - Dentition: Edentulous Vision: Functional for self-feeding Self-Feeding Abilities: Needs assist Patient Positioning: Upright in chair;Postural control adequate for testing Baseline Vocal Quality: Low vocal intensity Volitional Cough: Strong Volitional Swallow: Unable to elicit    Oral/Motor/Sensory Function Overall Oral Motor/Sensory Function: Within functional limits   Ice Chips Ice chips: Within functional limits Presentation: Spoon   Thin Liquid Thin Liquid: Impaired Presentation: Cup Pharyngeal  Phase Impairments: Cough - Immediate    Nectar Thick Nectar Thick Liquid: Impaired Presentation: Cup;Straw Pharyngeal Phase Impairments: Cough - Delayed(via straw)   Honey Thick Honey Thick Liquid: Not tested   Puree Puree: Within functional limits Presentation: Spoon   Solid     Solid: Impaired Presentation: Charolotte Eke) Oral Phase Impairments: Impaired mastication Pharyngeal Phase Impairments: Cough - Immediate     Myliah Medel I. Vear Clock, MS, CCC-SLP Acute Rehabilitation Services Office number [phone] Pager [phone]  Scheryl Marten 02/09/2020,1:58 PM

## 2020-02-09 NOTE — Plan of Care (Signed)
  Problem: Education: Goal: Knowledge of General Education information will improve Description Including pain rating scale, medication(s)/side effects and non-pharmacologic comfort measures Outcome: Progressing   Problem: Health Behavior/Discharge Planning: Goal: Ability to manage health-related needs will improve Outcome: Progressing   

## 2020-02-09 NOTE — Progress Notes (Signed)
Inpatient Diabetes Program Recommendations  AACE/ADA: New Consensus Statement on Inpatient Glycemic Control   Target Ranges:  Prepandial:   less than 140 mg/dL      Peak postprandial:   less than 180 mg/dL (1-2 hours)      Critically ill patients:  140 - 180 mg/dL   Results for ALCIDE, MEMOLI (MRN [id number]) as of 02/09/2020 12:28  Ref. Range 02/09/2020 01:10 02/09/2020 01:56 02/09/2020 03:03 02/09/2020 05:30 02/09/2020 11:16  Glucose-Capillary Latest Ref Range: 70 - 99 mg/dL 94 704 (H) 888 (H) 916 (H) 361 (H)  Results for CARLESS, SLATTEN (MRN [id number]) as of 02/09/2020 12:28  Ref. Range 02/08/2020 19:40 02/08/2020 20:19 02/08/2020 21:13 02/08/2020 22:19 02/08/2020 23:47  Glucose-Capillary Latest Ref Range: 70 - 99 mg/dL 49 (L) 57 (L) 66 (L) 85 86   Review of Glycemic Control  Diabetes history: DM2 Outpatient Diabetes medications: Levemir 50 units daily, Metformin 1000 mg daily Current orders for Inpatient glycemic control: None; CBGs monitoring  Inpatient Diabetes Program Recommendations:    Insulin-Hypoglycemia has resolved and glucose up to 361 mg/dl. Please consider ordering CBGs Q4H, and Novolog 0-6 units Q4H. If glucose is over 180 mg/dl with Novolog correction, please consider ordering Levemir 15 units daily (based on 73.6 kg x 0.2 units).  Outpatient: If Levemir is needed and ordered as recommended above and glucose remains stable on that dose of Levemir would recommend discharging patient on similar dose of Levemir and have patient follow up with PCP regarding glycemic control.   Thanks, Orlando Penner, RN, MSN, CDE Diabetes Coordinator Inpatient Diabetes Program [phone] (Team Pager from 8am to 5pm)

## 2020-02-09 NOTE — Progress Notes (Signed)
Pt received to 4E27 at 23:35 from ED. Pt alert and oriented. Placed on monitor and vitals taken. Vital signs stable, CBG 84. Infusion of Dextrose 10% running on transfer. Paged [facility] admissions to notifiy of patient arrival.

## 2020-02-09 NOTE — H&P (Signed)
History and Physical    Joseph Mckay GLO:[id number] DOB: 1956-11-14 DOA: 02/08/2020  PCP: Dalbert Mayotte, MD Patient coming from: Home  Chief Complaint: Hypoglycemia, unresponsive  HPI: Joseph Mckay is a 64 y.o. male with medical history significant of Parkinson's disease, dysphagia, insulin-dependent type 2 diabetes, BPH, hypertension, hyperlipidemia presenting to the ED via EMS for evaluation of hypoglycemia.  No family available at this time.  Wife reported to ED provider that patient was acting normally this morning but in the afternoon she found him unresponsive with blood sugar of 30.  EMS was called and an initial CBG was in the 20s.  Patient was given D10 infusion with return of consciousness.  Wife reported that patient has been receiving 50 units of Levemir every morning and that he has had poor oral intake due to his Parkinson's, dysphagia, and currently expecting G-tube placement with [facility] within the next 2 weeks as outpatient for better nutrition.  Patient states he was told he passed out at home.  He has not been eating much as he does not have an appetite.  States he is on insulin and his wife gives it to him.  He is supposed to get a feeding tube placed next week.  Also reports coughing and having shortness of breath.  Denies chest pain.  States he lost balance while walking a few days ago and hit his head but did not lose consciousness.  Since the fall he is having pain in his left hip.  No additional history could be obtained from him.  ED Course: Not tachycardic or hypoxic.  Blood pressure slightly elevated.  Blood glucose 49 on arrival to the ED.  WBC count 10.9.  Hemoglobin 11.6.  Started on D10 infusion.  Review of Systems:  All systems reviewed and apart from history of presenting illness, are negative.  Past Medical History:  Diagnosis Date  . Diabetes mellitus without complication (HCC)     History reviewed. No pertinent surgical history.   reports that he  has quit smoking. His smoking use included cigarettes. He smoked 1.00 pack per day. He has never used smokeless tobacco. He reports previous alcohol use. He reports previous drug use.  No Known Allergies  History reviewed. No pertinent family history.  Prior to Admission medications   Medication Sig Start Date End Date Taking? Authorizing Provider  aspirin EC 81 MG tablet Take 81 mg by mouth daily.   Yes [provider]  atorvastatin (LIPITOR) 40 MG tablet Take 40 mg by mouth daily.   Yes [provider]  carbidopa-levodopa (SINEMET IR) 25-100 MG tablet Take 1 tablet by mouth 3 (three) times daily.   Yes [provider]  docusate sodium (COLACE) 100 MG capsule Take 100 mg by mouth 2 (two) times daily.   Yes [provider]  insulin detemir (LEVEMIR) 100 UNIT/ML injection Inject 50 Units into the skin daily.    Yes [provider]  lisinopril (ZESTRIL) 20 MG tablet Take 20 mg by mouth daily.   Yes [provider]  metFORMIN (GLUCOPHAGE) 1000 MG tablet Take 1,000 mg by mouth daily.    Yes [provider]  polyethylene glycol (MIRALAX / GLYCOLAX) 17 g packet Take 17 g by mouth daily.   Yes [provider]  tamsulosin (FLOMAX) 0.4 MG CAPS capsule Take 0.8 mg by mouth at bedtime.   Yes [provider]  traZODone (DESYREL) 50 MG tablet Take 50 mg by mouth at bedtime.   Yes [provider]    Physical Exam: Vitals:   02/08/20 2230 02/08/20 2245 02/08/20 2300 02/08/20 2345  BP: (!) 164/83 (!) 165/83 (!) 160/92 (!) 163/85  Pulse: 78 76 84 83  Resp: 15 14 19  (!) 21  Temp:    (!) 100.7 F (38.2 C)  TempSrc:    Oral  SpO2: 100% 100% 99% 100%  Weight:    73.6 kg  Height:    6\' 2"  (1.88 m)    Physical Exam  Constitutional: He is oriented to person, place, and time. No distress.  HENT:  Head: Normocephalic.  Eyes: Right eye exhibits no discharge. Left eye exhibits no discharge.  Cardiovascular: Normal  rate, regular rhythm and intact distal pulses.  Pulmonary/Chest: Effort normal. No respiratory distress. He has no wheezes. He has no rales.  Abdominal: Soft. Bowel sounds are normal. He exhibits no distension. There is no abdominal tenderness. There is no guarding.  Musculoskeletal:        General: No edema.     Cervical back: Neck supple.  Neurological: He is alert and oriented to person, place, and time.  Moving all extremities on command  Skin: Skin is warm and dry. He is not diaphoretic.     Labs on Admission: I have personally reviewed following labs and imaging studies  CBC: Recent Labs  Lab 02/08/20 1937  WBC 10.9*  NEUTROABS 9.6*  HGB 11.6*  HCT 37.3*  MCV 86.9  PLT 323   Basic Metabolic Panel: Recent Labs  Lab 02/08/20 1937  NA 136  K 4.0  CL 102  CO2 24  GLUCOSE 72  BUN 15  CREATININE 0.46*  CALCIUM 8.6*   GFR: Estimated Creatinine Clearance: 94.6 mL/min (A) (by C-G formula based on SCr of 0.46 mg/dL (L)). Liver Function Tests: No results for input(s): AST, ALT, ALKPHOS, BILITOT, PROT, ALBUMIN in the last 168 hours. No results for input(s): LIPASE, AMYLASE in the last 168 hours. No results for input(s): AMMONIA in the last 168 hours. Coagulation Profile: No results for input(s): INR, PROTIME in the last 168 hours. Cardiac Enzymes: No results for input(s): CKTOTAL, CKMB, CKMBINDEX, TROPONINI in the last 168 hours. BNP (last 3 results) No results for input(s): PROBNP in the last 8760 hours. HbA1C: No results for input(s): HGBA1C in the last 72 hours. CBG: Recent Labs  Lab 02/08/20 2219 02/08/20 2347 02/09/20 0110 02/09/20 0156 02/09/20 0303  GLUCAP 85 86 94 104* 106*   Lipid Profile: No results for input(s): CHOL, HDL, LDLCALC, TRIG, CHOLHDL, LDLDIRECT in the last 72 hours. Thyroid Function Tests: No results for input(s): TSH, T4TOTAL, FREET4, T3FREE, THYROIDAB in the last 72 hours. Anemia Panel: No results for input(s): VITAMINB12, FOLATE,  FERRITIN, TIBC, IRON, RETICCTPCT in the last 72 hours. Urine analysis: No results found for: COLORURINE, APPEARANCEUR, LABSPEC, PHURINE, GLUCOSEU, HGBUR, BILIRUBINUR, KETONESUR, PROTEINUR, UROBILINOGEN, NITRITE, LEUKOCYTESUR  Radiological Exams on Admission: DG CHEST PORT 1 VIEW  Result Date: 02/09/2020 CLINICAL DATA:  64 year old male with syncope. EXAM: PORTABLE CHEST 1 VIEW COMPARISON:  Chest radiographs 09/12/2019 and earlier. FINDINGS: Portable AP semi upright view at 0307 hours. Confluent new airspace opacity at the right lung base. Stable underlying lung volumes. Mediastinal contours remain within normal limits. The upper lobes and left lung remain clear. No pneumothorax. No pleural effusion is evident. Visualized tracheal air column is within normal limits. No acute osseous abnormality identified. IMPRESSION: Right lung base pneumonia. Consider aspiration in the setting of syncope. Followup PA and lateral chest X-ray is recommended in  3-4 weeks following trial of antibiotic therapy to ensure resolution and exclude underlying malignancy. Electronically Signed   By: Odessa Fleming M.D.   On: 02/09/2020 03:27    EKG: Not ordered in the ED.  Assessment/Plan Principal Problem:   Hypoglycemia Active Problems:   Type 2 diabetes mellitus (HCC)   Fever   Dysphagia   Essential hypertension   Hypoglycemia in the setting of insulin-dependent type 2 diabetes: Suspect hypoglycemia is due to ongoing insulin use in the setting of poor oral intake.  CBG was initially in the 20s upon EMS arrival.  He was started on D10 infusion and blood glucose readings have now improved.  Plan is to hold insulin and continue D10 infusion at this time.  Monitor CBG every 2 hours.  Syncope/unresponsiveness: Suspect related to severe hypoglycemia CBG was in the 20s upon EMS arrival.  Patient became responsive after receiving dextrose.  Continue management of hypoglycemia as mentioned above.  No seizure activity reported by  family.  PE less likely given no tachycardia or hypoxia.  ACS less likely given no complaints of chest pain. Will order EKG and high-sensitivity troponin level.  Recent fall: Patient reports a fall a few days ago due to him losing balance.  No reported loss of consciousness.  Does report injuring his head, will order CT head.  Also reports pain in his left hip since the fall, will order x-ray.  Order PT and OT evaluation.  Fever: No temperature recorded in the ED.  Subsequent labs showing fever with temperature 100.7 F.  Not tachycardic or hypotensive.  No significant leukocytosis on labs.  Will order chest x-ray to rule out pneumonia, UA to rule out UTI, blood culture x2, and check lactate level.  History of dysphagia: Family reported that patient has had poor oral intake is supposed to get G[facility]tube placed at [facility].  Nursing staff reported that patient has not had any choking and has been able to tolerate juice/food given to him in the ED for hypoglycemia.  Advised aspiration precautions, order SLP eval.  Hypertension: Blood pressure slightly elevated.  Continue home lisinopril.  Order hydralazine PRN SBP >170.   Mild anemia: Hemoglobin 11.6.  No prior labs for comparison.  Check FOBT and anemia panel.  DVT prophylaxis: Lovenox Code Status: Full code Family Communication: No family available at this time. Disposition Plan: Anticipate discharge after clinical improvement. Consults called: None Admission status: It is my clinical opinion that referral for OBSERVATION is reasonable and necessary in this patient based on the above information provided. The aforementioned taken together are felt to place the patient at high risk for further clinical deterioration. However it is anticipated that the patient may be medically stable for discharge from the hospital within 24 to 48 hours.  The medical decision making on this patient was of high complexity and the patient is at high risk for clinical  deterioration, therefore this is a level 3 visit.  John Giovanni MD [facility]  If 7PM-7AM, please contact night-coverage [email or web address]  02/09/2020, 3:42 AM

## 2020-02-10 ENCOUNTER — Inpatient Hospital Stay (HOSPITAL_COMMUNITY): Payer: Medicare Other

## 2020-02-10 DIAGNOSIS — E11649 Type 2 diabetes mellitus with hypoglycemia without coma: Secondary | ICD-10-CM | POA: Diagnosis not present

## 2020-02-10 DIAGNOSIS — E162 Hypoglycemia, unspecified: Secondary | ICD-10-CM | POA: Diagnosis not present

## 2020-02-10 LAB — CBC
HCT: 35 % — ABNORMAL LOW (ref 39.0–52.0)
Hemoglobin: 11.3 g/dL — ABNORMAL LOW (ref 13.0–17.0)
MCH: 27.6 pg (ref 26.0–34.0)
MCHC: 32.3 g/dL (ref 30.0–36.0)
MCV: 85.4 fL (ref 80.0–100.0)
Platelets: 202 10*3/uL (ref 150–400)
RBC: 4.1 MIL/uL — ABNORMAL LOW (ref 4.22–5.81)
RDW: 13.5 % (ref 11.5–15.5)
WBC: 7.2 10*3/uL (ref 4.0–10.5)
nRBC: 0 % (ref 0.0–0.2)

## 2020-02-10 LAB — GLUCOSE, CAPILLARY
Glucose-Capillary: 215 mg/dL — ABNORMAL HIGH (ref 70–99)
Glucose-Capillary: 179 mg/dL — ABNORMAL HIGH (ref 70–99)
Glucose-Capillary: 252 mg/dL — ABNORMAL HIGH (ref 70–99)

## 2020-02-10 LAB — BASIC METABOLIC PANEL
Anion gap: 11 (ref 5–15)
BUN: 11 mg/dL (ref 8–23)
CO2: 25 mmol/L (ref 22–32)
Calcium: 8.4 mg/dL — ABNORMAL LOW (ref 8.9–10.3)
Chloride: 99 mmol/L (ref 98–111)
Creatinine, Ser: 0.66 mg/dL (ref 0.61–1.24)
GFR calc Af Amer: >60 mL/min (ref >60)
GFR calc non Af Amer: >60 mL/min (ref >60)
Glucose, Bld: 199 mg/dL — ABNORMAL HIGH (ref 70–99)
Potassium: 4 mmol/L (ref 3.5–5.1)
Sodium: 135 mmol/L (ref 135–145)

## 2020-02-10 MED ORDER — INSULIN DETEMIR 100 UNIT/ML ~~LOC~~ SOLN
15.0000 [IU] | Freq: Every day | SUBCUTANEOUS | Status: DC
  Filled 2020-02-10: qty 0.15

## 2020-02-10 MED ORDER — AMOXICILLIN-POT CLAVULANATE 875-125 MG PO TABS: 1.0000 | ORAL_TABLET | Freq: Two times a day (BID) | ORAL | 0 refills | Status: AC

## 2020-02-10 MED ORDER — AMOXICILLIN-POT CLAVULANATE 250-62.5 MG/5ML PO SUSR: 250.0000 mg | Freq: Two times a day (BID) | ORAL | 0 refills | Status: DC

## 2020-02-10 MED ORDER — INSULIN DETEMIR 100 UNIT/ML ~~LOC~~ SOLN: 15.0000 [IU] | Freq: Every day | SUBCUTANEOUS | 11 refills | Status: AC

## 2020-02-10 NOTE — TOC Transition Note (Signed)
Transition of Care [facility]) - CM/SW Discharge Note   Patient Details  Name: Joseph Mckay MRN: [id number] Date of Birth: 08-Sep-1956  Transition of Care [facility]) CM/SW Contact:  Deveron Furlong, RN 02/10/2020, 3:06 PM   Clinical Narrative:    Spoke with wife regarding HH services.  Patient has used [facility] (PT, OT, RN, SLP) until last week.  They were planning to resume after PEG placement.  Wife states she would not want to change agencies.    Amelia Jo, RN with [facility] services.  Information faxed and will be processed on Monday to resume care.     Final next level of care: Home w Home Health Services Barriers to Discharge: No Barriers Identified   Patient Goals and CMS Choice Patient states their goals for this hospitalization and ongoing recovery are:: to go home CMS Medicare.gov Compare Post Acute Care list provided to:: Patient Represenative (must comment) Choice offered to / list presented to : Spouse  Discharge Plan and Services  HH Arranged: PT, OT, Speech Therapy HH Agency: [facility] Date [facility] Agency Contacted: 02/10/20 Time HH Agency Contacted: [phone] Representative spoke with at [facility] Agency: Toniann Fail

## 2020-02-10 NOTE — Progress Notes (Signed)
Discharge:  IV access discontinued.  CCMD notified.  Discharge instructions and medications reviewed with patient and wife.   Patient assisted to private vehicle without difficulty.Marland Kitchen

## 2020-02-10 NOTE — Discharge Summary (Addendum)
Physician Discharge Summary  DUEY LILLER WNI:[id number] DOB: [birth year] DOA: 02/08/2020  PCP: Olive Bass, MD  Admit date: 02/08/2020 Discharge date: 02/10/2020  Admitted From: Home Disposition:  Home with home health   Recommendations for Outpatient Follow-up:  1. Follow up with PCP in 1 week  Discharge Condition: Stable CODE STATUS: Full  Diet Recommendation Dysphagia 1 (Puree);Nectar-thick liquid   Liquid Administration via: Cup;No straw Medication Administration: Crushed with puree Supervision: Intermittent supervision to cue for compensatory strategies Compensations: Small sips/bites;Slow rate;Effortful swallow Postural Changes: Seated upright at 90 degrees;Remain upright for at least 30 minutes after po intake    Brief/Interim Summary: Joseph Merlin Ferreeis a 64 y.o.malewith medical history significant ofParkinson's disease, dysphagia, insulin-dependent type 2 diabetes, BPH, hypertension, hyperlipidemia presenting to the ED via EMS for evaluation of hypoglycemia.Wife reported to ED provider that patient was acting normally this morning but in the afternoon she found him unresponsive with blood sugar of 30. EMS was called and an initial CBG was in the 20s. Patient was given D10 infusion with return of consciousness. Wife reported that patient has been receiving 50 units of Levemir every morning and that he has had poor oral intake due to his Parkinson's, dysphagia, and currently expecting G-tube placement with [facility] within the next 2 weeks as outpatient for better nutrition. He was treated with IV D10 with improvement in blood sugars. He was found to have aspiration pneumonia and was started on unasyn. On day of discharge, patient was alert, sitting in chair, at his baseline mentation and functional status.   Discharge Diagnoses:  Principal Problem:   Hypoglycemia Active Problems:   Type 2 diabetes mellitus (HCC)   Fever   Dysphagia   Essential  hypertension    Hypoglycemia in the setting of insulin-dependent type 2 diabetes: -Suspect hypoglycemia is due to ongoing insulin use in the setting of poor oral intake -Hypoglycemia resolved -Decrease levemir dose on discharge 50u --> 15u   Syncope/unresponsiveness -Suspect related to severe hypoglycemia  -CT head: No evidence of intracranial injury -Resolved  Aspiration pneumonia, present on admission without sepsis -Chest x-ray: Right lung base pneumonia, Followup PA and lateral chest X-ray is recommended in 3-4 weeks following trial of antibiotic therapy to ensure resolution and exclude underlying malignancy. -Unasyn --> Augmentin   Recent fall -Left hip xray: No acute finding. -PT OT rec home health   History of dysphagia -SLP eval rec dysphagia 1   Hypertension -Continue lisinopril  Parkinson's disease -Continue sinemet   CAD -Continue aspirin, lipitor    Discharge Instructions  Discharge Instructions    Call MD for:  difficulty breathing, headache or visual disturbances   Complete by: As directed    Call MD for:  extreme fatigue   Complete by: As directed    Call MD for:  persistant dizziness or light-headedness   Complete by: As directed    Call MD for:  persistant nausea and vomiting   Complete by: As directed    Call MD for:  severe uncontrolled pain   Complete by: As directed    Call MD for:  temperature >100.4   Complete by: As directed    Discharge instructions   Complete by: As directed    You were cared for by a hospitalist during your hospital stay. If you have any questions about your discharge medications or the care you received while you were in the hospital after you are discharged, you can call the unit and ask to speak with the hospitalist  on call if the hospitalist that took care of you is not available. Once you are discharged, your primary care physician will handle any further medical issues. Please note that NO REFILLS for any  discharge medications will be authorized once you are discharged, as it is imperative that you return to your primary care physician (or establish a relationship with a primary care physician if you do not have one) for your aftercare needs so that they can reassess your need for medications and monitor your lab values.   Discharge instructions   Complete by: As directed    Diet: Dysphagia 1 (Puree);Nectar-thick liquid   Liquid Administration via: Cup;No straw Medication Administration: Crushed with puree Supervision: Intermittent supervision to cue for compensatory strategies Compensations: Small sips/bites;Slow rate;Effortful swallow Postural Changes: Seated upright at 90 degrees;Remain upright for at least 30 minutes after po intake   Increase activity slowly   Complete by: As directed      Allergies as of 02/10/2020   No Known Allergies     Medication List    TAKE these medications   amoxicillin-clavulanate 250-62.5 MG/5ML suspension Commonly known as: AUGMENTIN Take 5 mLs (250 mg total) by mouth 2 (two) times daily for 3 days.   aspirin EC 81 MG tablet Take 81 mg by mouth daily.   atorvastatin 40 MG tablet Commonly known as: LIPITOR Take 40 mg by mouth daily.   carbidopa-levodopa 25-100 MG tablet Commonly known as: SINEMET IR Take 1 tablet by mouth 3 (three) times daily.   docusate sodium 100 MG capsule Commonly known as: COLACE Take 100 mg by mouth 2 (two) times daily.   insulin detemir 100 UNIT/ML injection Commonly known as: LEVEMIR Inject 0.15 mLs (15 Units total) into the skin daily. What changed: how much to take   lisinopril 20 MG tablet Commonly known as: ZESTRIL Take 20 mg by mouth daily.   metFORMIN 1000 MG tablet Commonly known as: GLUCOPHAGE Take 1,000 mg by mouth daily.   polyethylene glycol 17 g packet Commonly known as: MIRALAX / GLYCOLAX Take 17 g by mouth daily.   tamsulosin 0.4 MG Caps capsule Commonly known as: FLOMAX Take 0.8 mg by  mouth at bedtime.   traZODone 50 MG tablet Commonly known as: DESYREL Take 50 mg by mouth at bedtime.      Follow-up Information    Joseph, [name], MD. Schedule an appointment as soon as possible for a visit in 1 week(s).   Specialty: Family Medicine Contact information: [address] [place] [address] [phone]          No Known Allergies  Consultations:  None    Procedures/Studies: CT HEAD WO CONTRAST  Result Date: 02/09/2020 CLINICAL DATA:  Head injury after fall a few days ago EXAM: CT HEAD WITHOUT CONTRAST TECHNIQUE: Contiguous axial images were obtained from the base of the skull through the vertex without intravenous contrast. COMPARISON:  08/03/2019 FINDINGS: Brain: No evidence of acute infarction, hemorrhage, hydrocephalus, extra-axial collection or mass lesion/mass effect. Cerebral volume loss mild chronic small vessel ischemia. Vascular: Atherosclerotic calcification. Skull: Posterior scalp contusion.  No calvarial fracture Sinuses/Orbits: Clear IMPRESSION: 1. No evidence of intracranial injury. 2. Posterior scalp contusion without fracture. Electronically Signed   By: Marnee Spring M.D.   On: 02/09/2020 04:44   DG CHEST PORT 1 VIEW  Result Date: 02/09/2020 CLINICAL DATA:  64 year old male with syncope. EXAM: PORTABLE CHEST 1 VIEW COMPARISON:  Chest radiographs 09/12/2019 and earlier. FINDINGS: Portable AP semi upright view at [phone]  hours. Confluent new airspace opacity at the right lung base. Stable underlying lung volumes. Mediastinal contours remain within normal limits. The upper lobes and left lung remain clear. No pneumothorax. No pleural effusion is evident. Visualized tracheal air column is within normal limits. No acute osseous abnormality identified. IMPRESSION: Right lung base pneumonia. Consider aspiration in the setting of syncope. Followup PA and lateral chest X-ray is recommended in 3-4 weeks following trial of antibiotic therapy to ensure  resolution and exclude underlying malignancy. Electronically Signed   By: Genevie Ann M.D.   On: 02/09/2020 03:27   DG HIP UNILAT WITH PELVIS 2-3 VIEWS LEFT  Result Date: 02/09/2020 CLINICAL DATA:  Fall with left hip pain EXAM: DG HIP (WITH OR WITHOUT PELVIS) 2-3V LEFT COMPARISON:  None. FINDINGS: No evidence of pelvic ring fracture or diastasis. There is limited visualization due to colonic gas and stool. No hip dislocation or visible fracture. Atherosclerotic calcification is extensive. Advanced lower lumbar disc degeneration. Partially visualized biliary system stent. IMPRESSION: No acute finding. Electronically Signed   By: Monte Fantasia M.D.   On: 02/09/2020 04:38       Discharge Exam: Vitals:   02/09/20 2015 02/10/20 0758  BP: (!) 144/79 117/71  Pulse:  95  Resp:  (!) 31  Temp: 98 F (36.7 C) 98 F (36.7 C)  SpO2: 96% 94%    General: Pt is alert, awake, not in acute distress Cardiovascular: RRR, S1/S2 +, no edema Respiratory: CTA bilaterally, no wheezing, no rhonchi, no respiratory distress, on room air  Abdominal: Soft, NT, ND, bowel sounds + Extremities: no edema, no cyanosis Psych: Stable     The results of significant diagnostics from this hospitalization (including imaging, microbiology, ancillary and laboratory) are listed below for reference.     Microbiology: Recent Results (from the past 240 hour(s))  SARS CORONAVIRUS 2 (TAT 6-24 HRS) Nasopharyngeal Nasopharyngeal Swab     Status: None   Collection Time: 02/08/20 10:20 PM   Specimen: Nasopharyngeal Swab  Result Value Ref Range Status   SARS Coronavirus 2 NEGATIVE NEGATIVE Final    Comment: (NOTE) SARS-CoV-2 target nucleic acids are NOT DETECTED. The SARS-CoV-2 RNA is generally detectable in upper and lower respiratory specimens during the acute phase of infection. Negative results do not preclude SARS-CoV-2 infection, do not rule out co-infections with other pathogens, and should not be used as the sole  basis for treatment or other patient management decisions. Negative results must be combined with clinical observations, patient history, and epidemiological information. The expected result is Negative. Fact Sheet for Patients: SugarRoll.be Fact Sheet for Healthcare Providers: https://www.woods-mathews.com/ This test is not yet approved or cleared by the [place] FDA and  has been authorized for detection and/or diagnosis of SARS-CoV-2 by FDA under an Emergency Use Authorization (EUA). This EUA will remain  in effect (meaning this test can be used) for the duration of the COVID-19 declaration under Section 56 4(b)(1) of the Act, 21 U.S.C. section 360bbb-3(b)(1), unless the authorization is terminated or revoked sooner. Performed at [facility], [place] [address] [place],  [address]      Labs: BNP (last 3 results) No results for input(s): BNP in the last 8760 hours. Basic Metabolic Panel: Recent Labs  Lab 02/08/20 1937 02/10/20 0339  NA 136 135  K 4.0 4.0  CL 102 99  CO2 24 25  GLUCOSE 72 199*  BUN 15 11  CREATININE 0.46* 0.66  CALCIUM 8.6* 8.4*   Liver Function Tests: No results  for input(s): AST, ALT, ALKPHOS, BILITOT, PROT, ALBUMIN in the last 168 hours. No results for input(s): LIPASE, AMYLASE in the last 168 hours. No results for input(s): AMMONIA in the last 168 hours. CBC: Recent Labs  Lab 02/08/20 1937 02/09/20 0333 02/10/20 0339  WBC 10.9* 8.1 7.2  NEUTROABS 9.6*  --   --   HGB 11.6* 11.8* 11.3*  HCT 37.3* 36.0* 35.0*  MCV 86.9 84.1 85.4  PLT 196 199 202   Cardiac Enzymes: No results for input(s): CKTOTAL, CKMB, CKMBINDEX, TROPONINI in the last 168 hours. BNP: Invalid input(s): POCBNP CBG: Recent Labs  Lab 02/09/20 1605 02/09/20 2013 02/09/20 2348 02/10/20 0512 02/10/20 0800  GLUCAP 385* 347* 177* 215* 179*   D-Dimer No results for input(s): DDIMER in the last 72 hours. Hgb  A1c Recent Labs    02/09/20 1251  HGBA1C 7.9*   Lipid Profile No results for input(s): CHOL, HDL, LDLCALC, TRIG, CHOLHDL, LDLDIRECT in the last 72 hours. Thyroid function studies No results for input(s): TSH, T4TOTAL, T3FREE, THYROIDAB in the last 72 hours.  Invalid input(s): FREET3 Anemia work up Recent Labs    02/09/20 0703  VITAMINB12 267  FOLATE 21.0  FERRITIN 122  TIBC 207*  IRON 13*  RETICCTPCT 1.1   Urinalysis    Component Value Date/Time   COLORURINE YELLOW 02/09/2020 0338   APPEARANCEUR CLEAR 02/09/2020 0338   LABSPEC 1.012 02/09/2020 0338   PHURINE 8.0 02/09/2020 0338   GLUCOSEU NEGATIVE 02/09/2020 0338   HGBUR NEGATIVE 02/09/2020 0338   BILIRUBINUR NEGATIVE 02/09/2020 0338   KETONESUR NEGATIVE 02/09/2020 0338   PROTEINUR NEGATIVE 02/09/2020 0338   NITRITE NEGATIVE 02/09/2020 0338   LEUKOCYTESUR NEGATIVE 02/09/2020 0338   Sepsis Labs Invalid input(s): PROCALCITONIN,  WBC,  LACTICIDVEN Microbiology Recent Results (from the past 240 hour(s))  SARS CORONAVIRUS 2 (TAT 6-24 HRS) Nasopharyngeal Nasopharyngeal Swab     Status: None   Collection Time: 02/08/20 10:20 PM   Specimen: Nasopharyngeal Swab  Result Value Ref Range Status   SARS Coronavirus 2 NEGATIVE NEGATIVE Final    Comment: (NOTE) SARS-CoV-2 target nucleic acids are NOT DETECTED. The SARS-CoV-2 RNA is generally detectable in upper and lower respiratory specimens during the acute phase of infection. Negative results do not preclude SARS-CoV-2 infection, do not rule out co-infections with other pathogens, and should not be used as the sole basis for treatment or other patient management decisions. Negative results must be combined with clinical observations, patient history, and epidemiological information. The expected result is Negative. Fact Sheet for Patients: HairSlick.no Fact Sheet for Healthcare Providers: quierodirigir.com This test  is not yet approved or cleared by the [place] FDA and  has been authorized for detection and/or diagnosis of SARS-CoV-2 by FDA under an Emergency Use Authorization (EUA). This EUA will remain  in effect (meaning this test can be used) for the duration of the COVID-19 declaration under Section 56 4(b)(1) of the Act, 21 U.S.C. section 360bbb-3(b)(1), unless the authorization is terminated or revoked sooner. Performed at [facility], [address] [place], [place] [address]      Patient was seen and examined on the day of discharge and was found to be in stable condition. Time coordinating discharge: 35 minutes including assessment and coordination of care, as well as examination of the patient.   SIGNED:  Noralee Stain, DO [facility] 02/10/2020, 10:42 AM

## 2020-02-10 NOTE — Progress Notes (Signed)
  Speech Language Pathology Treatment: Dysphagia  Patient Details Name: Joseph Mckay MRN: [id number] DOB: Aug 25, 1956 Today's Date: 02/10/2020 Time: 9476-5465 SLP Time Calculation (min) (ACUTE ONLY): 15 min  Assessment / Plan / Recommendation Clinical Impression  F/u diet tolerance assessment complete after diet initiation 3/12. Patient alert and cooperative, seated in chair upon arrival, leaning forward. SLP repositioned upright at 90 degrees to maximize safe feeding and swallowing. Patient able to consume pureed solids and nectar thick liquids with mild oral transit delays and suspected delay in swallow initiation. Eventual delayed weak cough response noted following 3-4 bites of puree and 2-3 sips of liquid. Despite being recommended for current diet by previous MBS completed 3-4 weeks ago (report not available), patient continuing to present with evidence of aspiration. CXR complete this admission also noting right sided PNA. Discussed with RN who reports that although patient receiving PEG as OP, wife wants patient to be as safe as possible and will likely consume some pos at home following discharge planned for today. Recommend MBS this morning prior to discharge to ensure that least restrictive, safest diet is in place.    HPI HPI: Pt is a 64 y.o. male with medical history significant of Parkinson's disease, dysphagia, insulin-dependent type 2 diabetes, BPH, hypertension, hyperlipidemia presenting to the ED via EMS for evaluation of hypoglycemia. Chest x-ray: Right lung base pneumonia. Consider aspiration in the setting of syncope. Nursing reported coughing with breakfast. Per pt, he has been having difficulty swallowing and has been seeing an SLP for this. Pt reported that he has been having pureed foods with thickened liquids.       SLP Plan  MBS       Recommendations  Diet recommendations: Dysphagia 1 (puree);Nectar-thick liquid Liquids provided via: Cup;No straw Medication  Administration: Crushed with puree Supervision: Full supervision/cueing for compensatory strategies Compensations: Small sips/bites;Slow rate;Effortful swallow Postural Changes and/or Swallow Maneuvers: Seated upright 90 degrees                Oral Care Recommendations: Oral care BID Follow up Recommendations: Home health SLP SLP Visit Diagnosis: Dysphagia, oropharyngeal phase (R13.12) Plan: MBS       GO             Dorothie Wah MA, CCC-SLP     Blakelynn Scheeler Meryl 02/10/2020, 10:23 AM

## 2020-02-10 NOTE — Progress Notes (Signed)
  Speech Language Pathology Treatment: Dysphagia  Patient Details Name: Joseph Mckay MRN: [id number] DOB: 05/29/1956 Today's Date: 02/10/2020 Time: 8916-9450 SLP Time Calculation (min) (ACUTE ONLY): 28 min  Assessment / Plan / Recommendation Clinical Impression  Returned to do education with patient and spouse following MBS. Spent almost thirty minutes completing education with wife regarding this mornings MBS results, how they likely compared with previous study, diet recommendations, risk of aspiration, and risk of dehydration/malnutrition with restrictive diet. Wife required repetition of information as well as explanation of written material provided to further explain above. Wife reported concern that patient will not consume pos, becoming more fatigued and worse medically. Wishes to speak to MD as she reported concern regarding discharge today. RN made aware. SLP will continue to f/u if patient does not discharge today.    HPI HPI: Pt is a 64 y.o. male with medical history significant of Parkinson's disease, dysphagia, insulin-dependent type 2 diabetes, BPH, hypertension, hyperlipidemia presenting to the ED via EMS for evaluation of hypoglycemia. Chest x-ray: Right lung base pneumonia. Consider aspiration in the setting of syncope. Nursing reported coughing with breakfast. Per pt, he has been having difficulty swallowing and has been seeing an SLP for this. Pt reported that he has been having pureed foods with thickened liquids.       SLP Plan  Continue with current plan of care       Recommendations  Diet recommendations: Dysphagia 1 (puree);Pudding-thick liquid Liquids provided via: Teaspoon Medication Administration: Crushed with puree Supervision: Full supervision/cueing for compensatory strategies Compensations: Small sips/bites;Slow rate;Effortful swallow Postural Changes and/or Swallow Maneuvers: Seated upright 90 degrees                Oral Care Recommendations: Oral  care BID(and prior to ice chips/H2O in between meals) Follow up Recommendations: Home health SLP SLP Visit Diagnosis: Dysphagia, oropharyngeal phase (R13.12) Plan: Continue with current plan of care       GO             Deandra Gadson MA, CCC-SLP     Simra Fiebig Meryl 02/10/2020, 1:27 PM

## 2020-02-10 NOTE — Progress Notes (Signed)
Modified Barium Swallow Progress Note  Patient Details  Name: RAMAJ FRANGOS MRN: [id number] Date of Birth: 1956-07-22  Today's Date: 02/10/2020  Modified Barium Swallow completed.  Full report located under Chart Review in the Imaging Section.  Brief recommendations include the following:  Clinical Impression  Patient presents with what seems like a decline in overall swallowing function when compared to reports from spouse of previous MBS recommendations. Patient positioned as upright as possible in chair for study however posture remained generally in natural chin tuck position. Overall oral and laryngeal weakness results in decreasd bolus cohesion, tongue pumping, and delayed oral transit of bolus, delayed swallow initiation and decreased laryngeal closure with resultant silent aspiration of nectar and honey thick liquid both during and after the swallow due to post swallow residue. Although pureed solids not penetrated or aspirated during todays study, risk remains high given post swallow residue with inability to swiftly or effectively clear. Discussed with MD. Attempted to call spouse without answer. RN may call this SLP for education when spouse comes to pick patient up for discharge today. Note that patient has plans for PEG tube placement, possibly this month. From SLP standpoint, goal would be to maximize health for PEG tube placement if this is patient and family wishes, downgrading diet to dysphagia 1 (puree) with pudding thick liquid to minimze risks of aspiration. Would also consider initiation of the water protocol (oral care thirty minutes after po intake, then allowing plain water for hydration/pleasure with known risk of aspiration until next po inake). SLP will f/u while patient inpatient.    Swallow Evaluation Recommendations       SLP Diet Recommendations: Dysphagia 1 (Puree) solids;Pudding thick liquid   Liquid Administration via: Spoon   Medication Administration: Crushed  with puree   Supervision: Full assist for feeding;Full supervision/cueing for compensatory strategies   Compensations: Small sips/bites;Slow rate;Effortful swallow   Postural Changes: Seated upright at 90 degrees   Oral Care Recommendations: Oral care BID(and prior to plain unthickened H2O)   Other Recommendations: Remove water pitcher   Anola Mcgough MA, CCC-SLP   Izora Benn Meryl 02/10/2020,12:11 PM

## 2020-02-14 LAB — CULTURE, BLOOD (ROUTINE X 2)
Culture: NO GROWTH
Culture: NO GROWTH
Special Requests: ADEQUATE

## 2020-02-29 DEATH — deceased

## 2021-08-16 IMAGING — DX DG CHEST 1V PORT
1 series · 1 of 1 positions shown · non-contrast
Comparison: Chest radiographs 09/12/2019 and earlier.

CLINICAL DATA: 64-year-old male with syncope.

EXAM:
PORTABLE CHEST 1 VIEW

[chest ap]
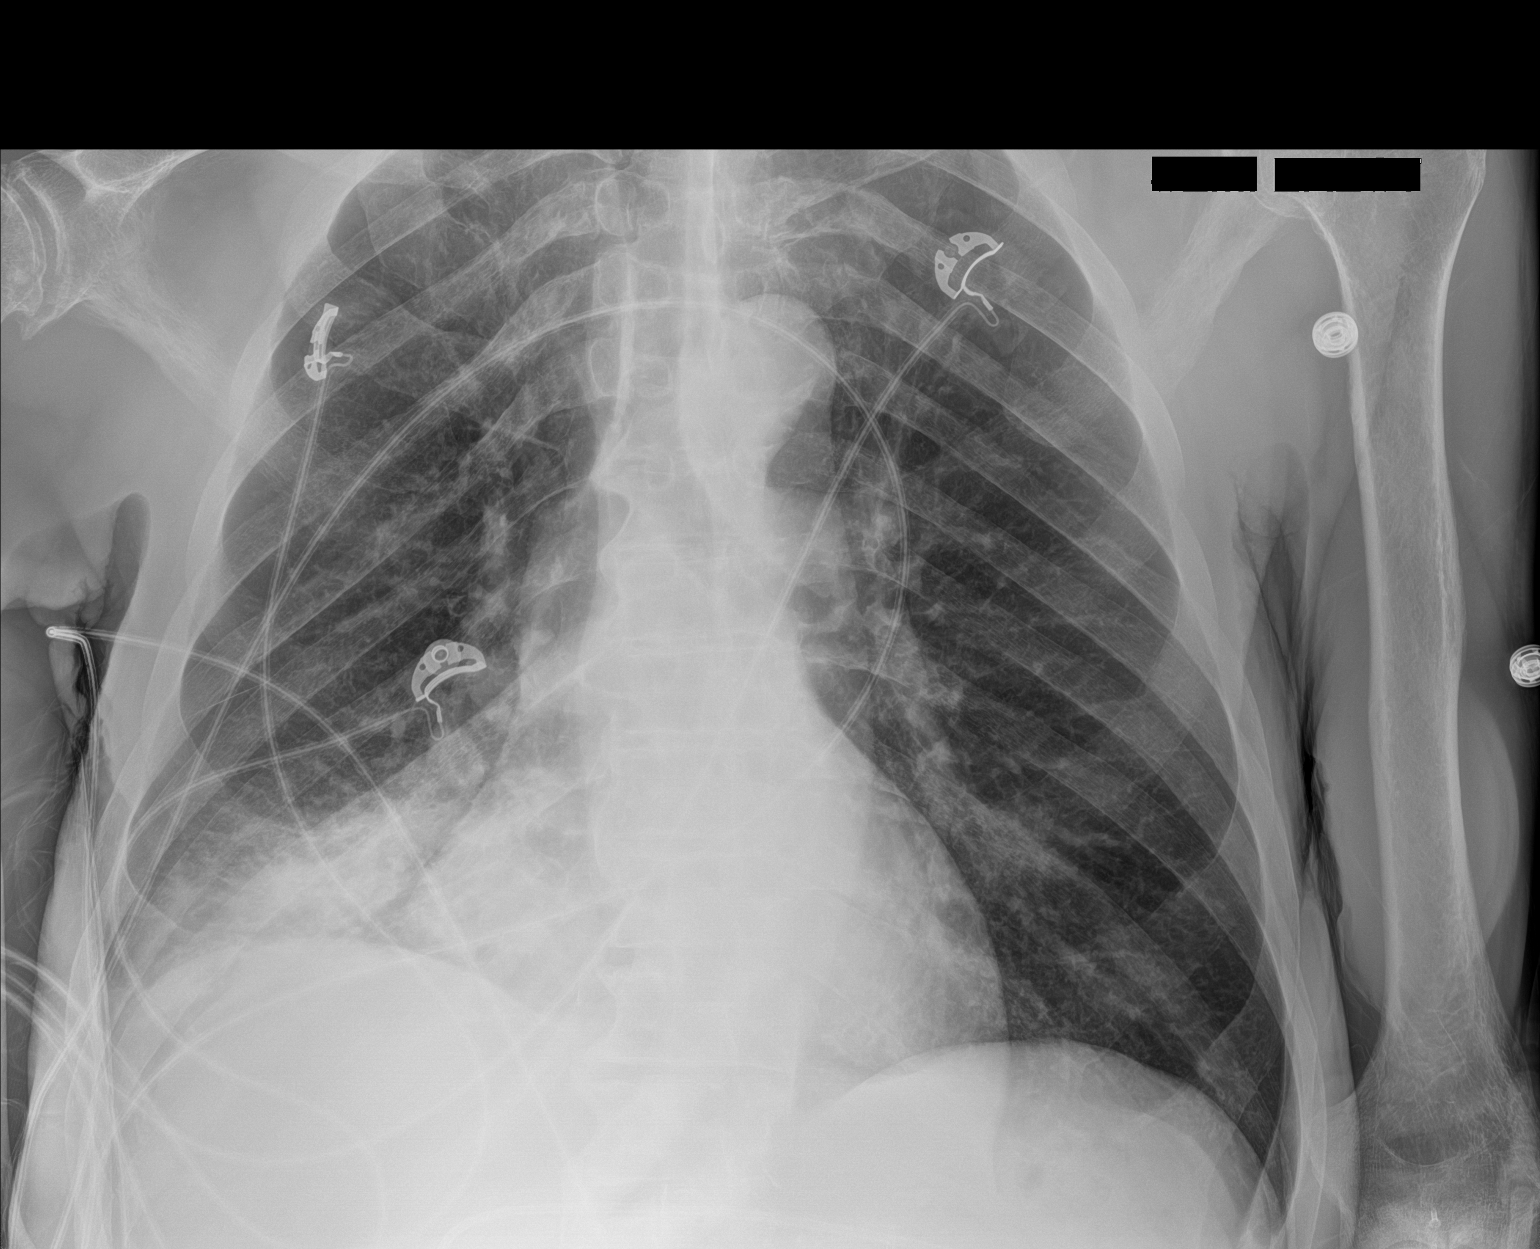

[1 of 1 positions shown; findings below may reference images not displayed]

FINDINGS: Portable AP semi upright view at 7670 hours. Confluent new airspace
opacity at the right lung base. Stable underlying lung volumes.
Mediastinal contours remain within normal limits. The upper lobes
and left lung remain clear. No pneumothorax. No pleural effusion is
evident. Visualized tracheal air column is within normal limits. No
acute osseous abnormality identified.
IMPRESSION: Right lung base pneumonia. Consider aspiration in the setting of
syncope.

Followup PA and lateral chest X-ray is recommended in 3-4 weeks
following trial of antibiotic therapy to ensure resolution and
exclude underlying malignancy.

## 2021-08-16 IMAGING — CR DG HIP (WITH OR WITHOUT PELVIS) 2-3V*L*
3 series · 3 of 3 positions shown · non-contrast
Comparison: None.

CLINICAL DATA: Fall with left hip pain

EXAM:
DG HIP (WITH OR WITHOUT PELVIS) 2-3V LEFT

[pelvis ap]
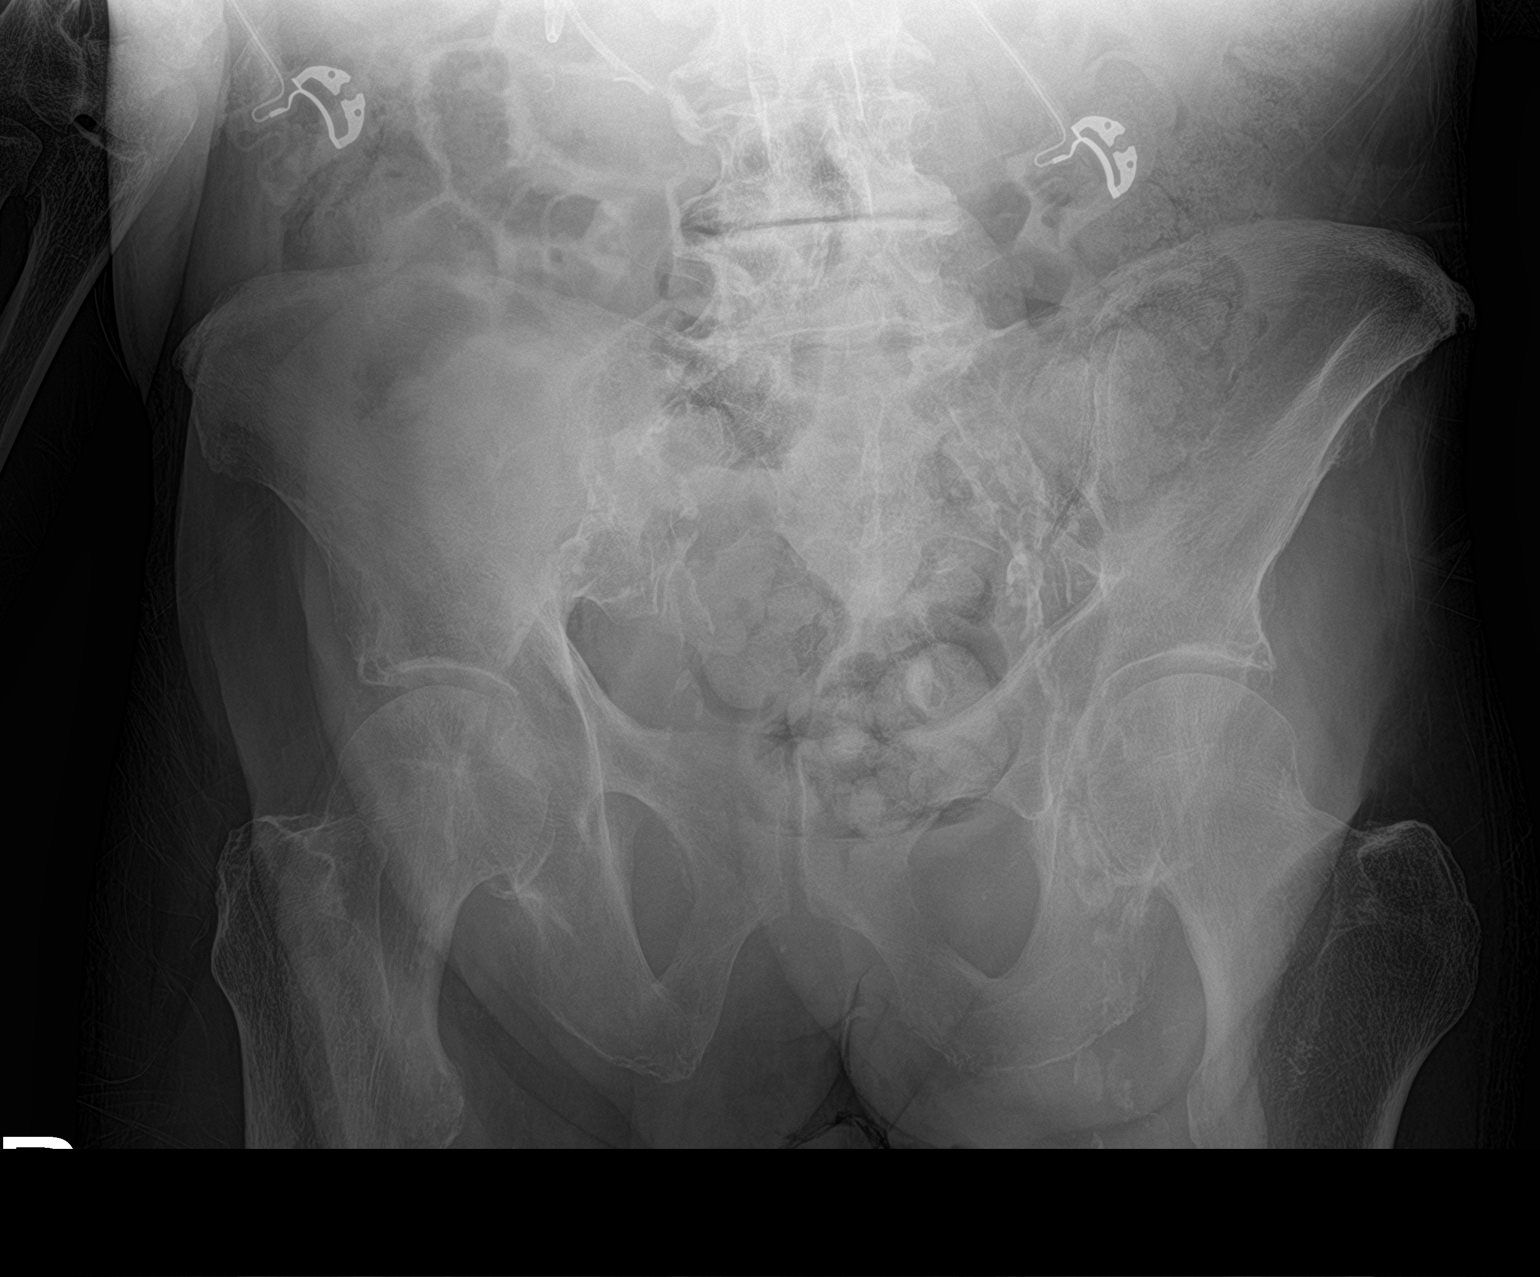

[hip lat]
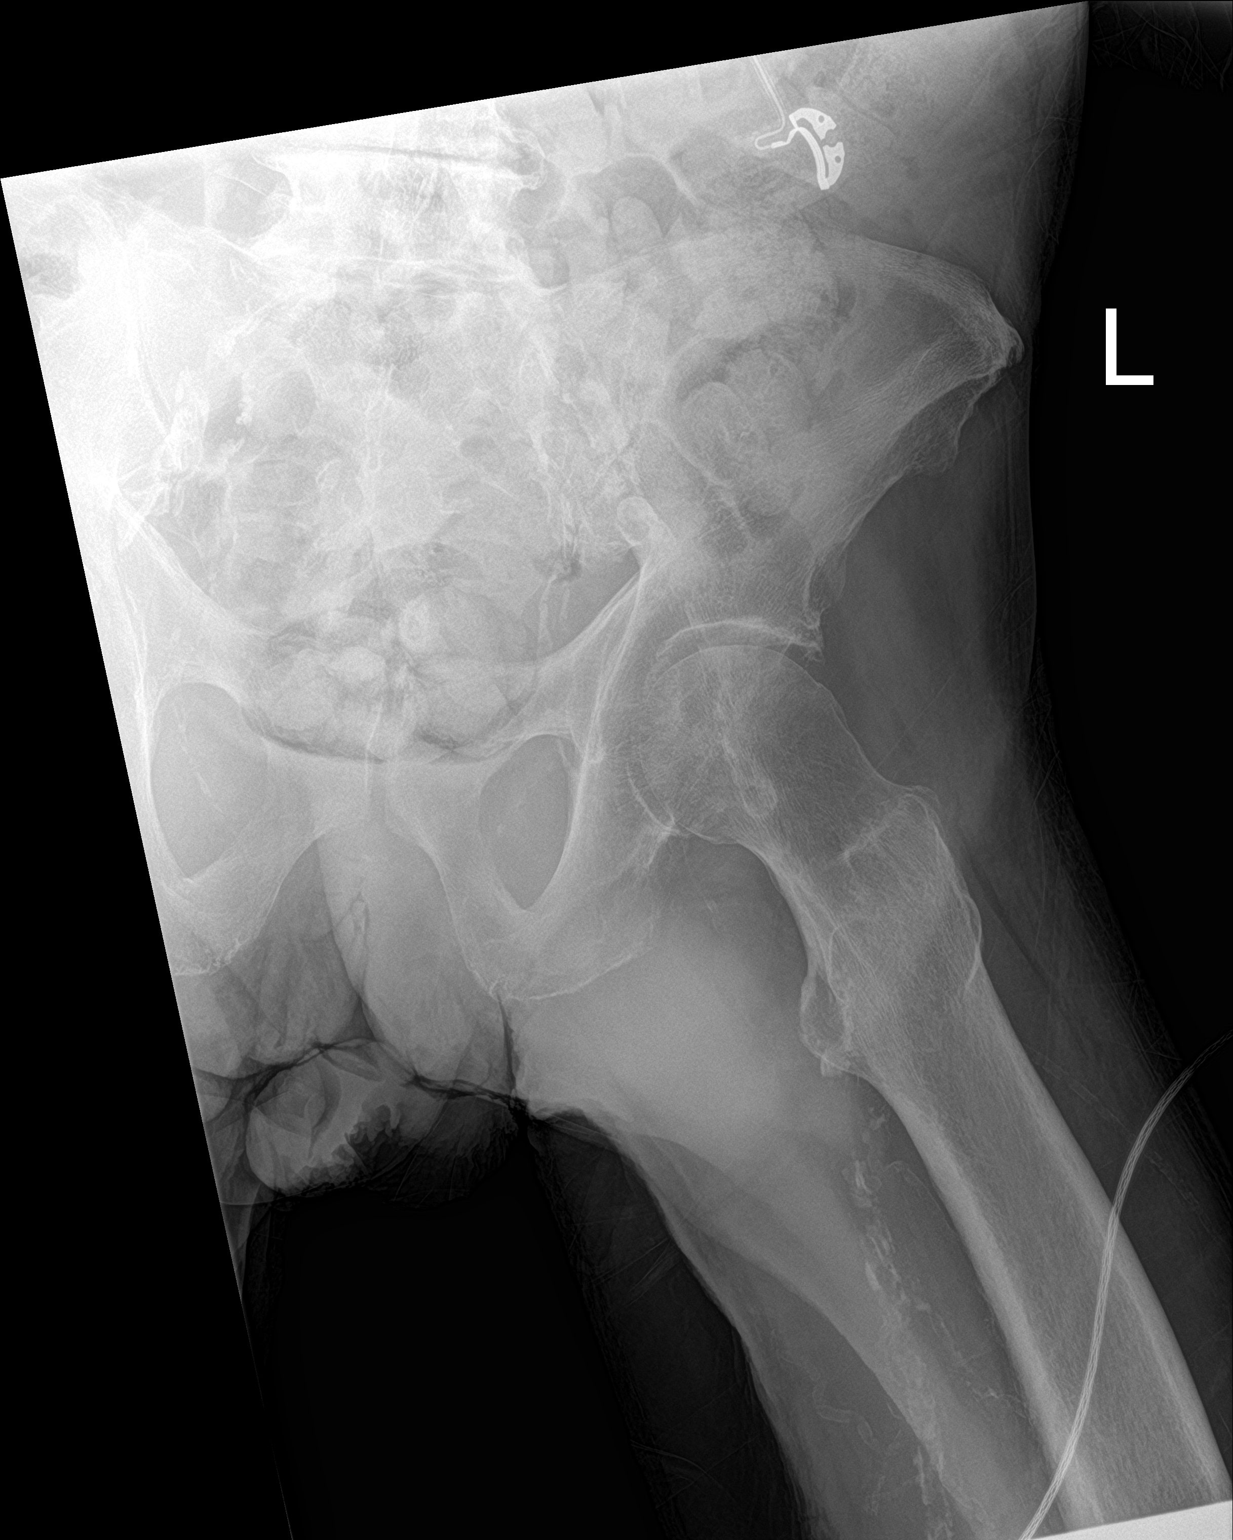

[hip ap]
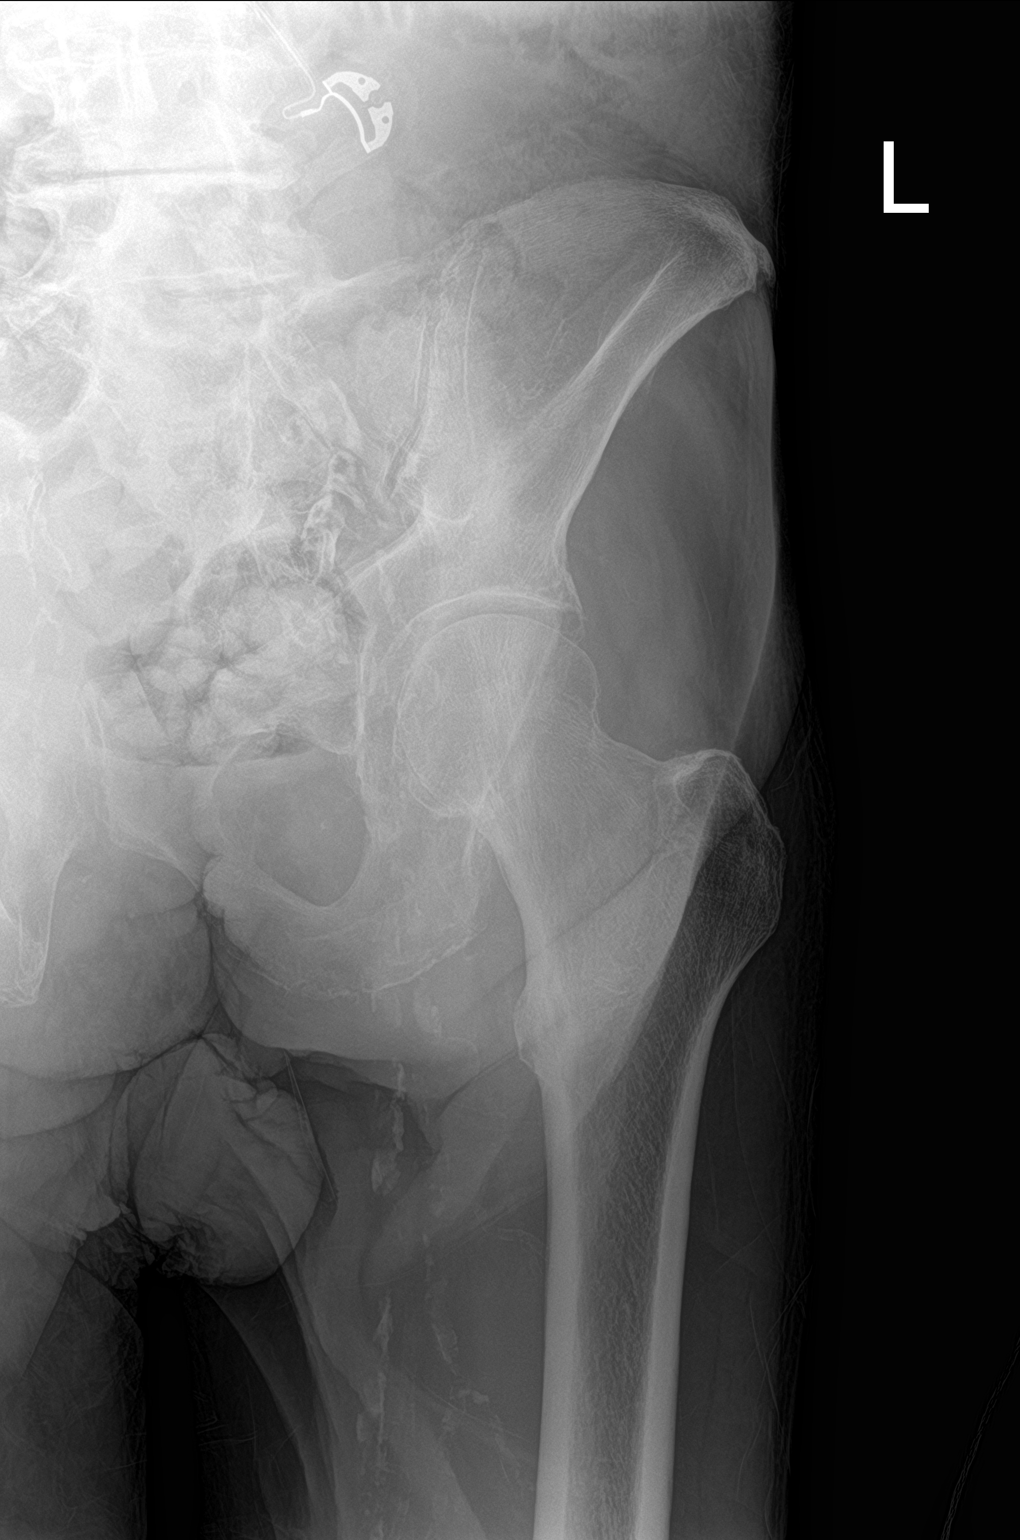

[3 of 3 positions shown; findings below may reference images not displayed]

FINDINGS: No evidence of pelvic ring fracture or diastasis. There is limited
visualization due to colonic gas and stool. No hip dislocation or
visible fracture. Atherosclerotic calcification is extensive.
Advanced lower lumbar disc degeneration. Partially visualized
biliary system stent.
IMPRESSION: No acute finding.
